# Patient Record
Sex: Female | Born: 1965 | Hispanic: No | Marital: Single | Smoking: Never smoker
Health system: Southern US, Community
[De-identification: ages and names within clinical notes are randomized; demographics above are authoritative.]

## PROBLEM LIST (undated history)

## (undated) DIAGNOSIS — I10 Essential (primary) hypertension: Secondary | ICD-10-CM

## (undated) HISTORY — PX: TONSILLECTOMY: SUR1361

## (undated) HISTORY — PX: TUBAL LIGATION: SHX77

---

## 2000-09-27 ENCOUNTER — Ambulatory Visit (HOSPITAL_COMMUNITY): Admission: RE | Admit: 2000-09-27 | Discharge: 2000-09-27 | Payer: Self-pay | Admitting: Nephrology

## 2000-09-29 ENCOUNTER — Ambulatory Visit (HOSPITAL_COMMUNITY): Admission: RE | Admit: 2000-09-29 | Discharge: 2000-09-30 | Payer: Self-pay | Admitting: Nephrology

## 2000-09-29 ENCOUNTER — Encounter: Payer: Self-pay | Admitting: Nephrology

## 2000-09-29 ENCOUNTER — Encounter (INDEPENDENT_AMBULATORY_CARE_PROVIDER_SITE_OTHER): Payer: Self-pay

## 2001-02-21 ENCOUNTER — Other Ambulatory Visit: Admission: RE | Admit: 2001-02-21 | Discharge: 2001-02-21 | Payer: Self-pay | Admitting: Gynecology

## 2001-03-05 ENCOUNTER — Encounter: Payer: Self-pay | Admitting: Gynecology

## 2001-03-09 ENCOUNTER — Encounter (INDEPENDENT_AMBULATORY_CARE_PROVIDER_SITE_OTHER): Payer: Self-pay | Admitting: Specialist

## 2001-03-09 ENCOUNTER — Ambulatory Visit (HOSPITAL_COMMUNITY): Admission: RE | Admit: 2001-03-09 | Discharge: 2001-03-09 | Payer: Self-pay | Admitting: Gynecology

## 2002-02-21 ENCOUNTER — Other Ambulatory Visit: Admission: RE | Admit: 2002-02-21 | Discharge: 2002-02-21 | Payer: Self-pay | Admitting: Gynecology

## 2002-02-26 ENCOUNTER — Encounter: Payer: Self-pay | Admitting: Gynecology

## 2002-02-26 ENCOUNTER — Encounter: Admission: RE | Admit: 2002-02-26 | Discharge: 2002-02-26 | Payer: Self-pay | Admitting: Gynecology

## 2003-03-26 ENCOUNTER — Other Ambulatory Visit: Admission: RE | Admit: 2003-03-26 | Discharge: 2003-03-26 | Payer: Self-pay | Admitting: Gynecology

## 2005-10-11 ENCOUNTER — Ambulatory Visit (HOSPITAL_COMMUNITY): Admission: RE | Admit: 2005-10-11 | Discharge: 2005-10-11 | Payer: Self-pay | Admitting: Obstetrics & Gynecology

## 2010-04-20 ENCOUNTER — Emergency Department (HOSPITAL_COMMUNITY)
Admission: EM | Admit: 2010-04-20 | Discharge: 2010-04-20 | Payer: Self-pay | Source: Home / Self Care | Admitting: Family Medicine

## 2010-10-03 ENCOUNTER — Inpatient Hospital Stay (HOSPITAL_COMMUNITY)
Admission: EM | Admit: 2010-10-03 | Discharge: 2010-10-07 | Payer: Self-pay | Source: Home / Self Care | Attending: Pulmonary Disease | Admitting: Pulmonary Disease

## 2010-11-02 ENCOUNTER — Ambulatory Visit: Admit: 2010-11-02 | Payer: Self-pay | Admitting: Internal Medicine

## 2010-11-03 ENCOUNTER — Telehealth: Payer: Self-pay | Admitting: Internal Medicine

## 2010-11-07 ENCOUNTER — Encounter: Payer: Self-pay | Admitting: Internal Medicine

## 2010-11-07 ENCOUNTER — Encounter: Payer: Self-pay | Admitting: Obstetrics & Gynecology

## 2010-11-18 NOTE — Progress Notes (Signed)
Summary: nos appt  Phone Note Call from Patient   Caller: [email or web address]  Call For: Bevelyn Arriola Summary of Call: [facility] x2 to rsc nos from 1/17. Initial call taken by: Darletta Moll,  November 03, 2010 3:03 PM

## 2010-12-27 LAB — POCT I-STAT 3, ART BLOOD GAS (G3+)
Acid-base deficit: 2 mmol/L (ref 0.0–2.0)
O2 Saturation: 96 %
O2 Saturation: 99 %
Patient temperature: 39.2
TCO2: 22 mmol/L (ref 0–100)
pCO2 arterial: 30.4 mmHg — ABNORMAL LOW (ref 35.0–45.0)
pCO2 arterial: 31.7 mmHg — ABNORMAL LOW (ref 35.0–45.0)
pH, Arterial: 7.449 — ABNORMAL HIGH (ref 7.350–7.400)
pO2, Arterial: 78 mmHg — ABNORMAL LOW (ref 80.0–100.0)

## 2010-12-27 LAB — POCT I-STAT, CHEM 8
Calcium, Ion: 0.96 mmol/L — ABNORMAL LOW (ref 1.12–1.32)
Glucose, Bld: 180 mg/dL — ABNORMAL HIGH (ref 70–99)
HCT: 36 % (ref 36.0–46.0)
Potassium: 3.2 mEq/L — ABNORMAL LOW (ref 3.5–5.1)
Sodium: 139 mEq/L (ref 135–145)

## 2010-12-27 LAB — LEGIONELLA ANTIGEN, URINE: Legionella Antigen, Urine: NEGATIVE

## 2010-12-27 LAB — BASIC METABOLIC PANEL
BUN: 4 mg/dL — ABNORMAL LOW (ref 6–23)
BUN: 5 mg/dL — ABNORMAL LOW (ref 6–23)
BUN: 8 mg/dL (ref 6–23)
CO2: 24 mEq/L (ref 19–32)
CO2: 25 mEq/L (ref 19–32)
Calcium: 7.2 mg/dL — ABNORMAL LOW (ref 8.4–10.5)
Calcium: 7.5 mg/dL — ABNORMAL LOW (ref 8.4–10.5)
Calcium: 8.1 mg/dL — ABNORMAL LOW (ref 8.4–10.5)
Chloride: 106 mEq/L (ref 96–112)
Creatinine, Ser: 0.7 mg/dL (ref 0.4–1.2)
Creatinine, Ser: 0.72 mg/dL (ref 0.4–1.2)
Creatinine, Ser: 0.77 mg/dL (ref 0.4–1.2)
Creatinine, Ser: 0.9 mg/dL (ref 0.4–1.2)
GFR calc Af Amer: >60 mL/min (ref >60)
GFR calc non Af Amer: 60 mL/min — ABNORMAL LOW (ref >60)
GFR calc non Af Amer: >60 mL/min (ref >60)
GFR calc non Af Amer: >60 mL/min (ref >60)
Glucose, Bld: 93 mg/dL (ref 70–99)
Glucose, Bld: 94 mg/dL (ref 70–99)
Potassium: 2.9 mEq/L — ABNORMAL LOW (ref 3.5–5.1)
Potassium: 3.3 mEq/L — ABNORMAL LOW (ref 3.5–5.1)
Sodium: 139 mEq/L (ref 135–145)

## 2010-12-27 LAB — URINE MICROSCOPIC-ADD ON

## 2010-12-27 LAB — CULTURE, BLOOD (ROUTINE X 2)
Culture  Setup Time: 201112181957
Culture: NO GROWTH

## 2010-12-27 LAB — URINE CULTURE: Culture: NO GROWTH

## 2010-12-27 LAB — MAGNESIUM
Magnesium: 1.4 mg/dL — ABNORMAL LOW (ref 1.5–2.5)
Magnesium: 2.1 mg/dL (ref 1.5–2.5)

## 2010-12-27 LAB — GLUCOSE, CAPILLARY
Glucose-Capillary: 114 mg/dL — ABNORMAL HIGH (ref 70–99)
Glucose-Capillary: 150 mg/dL — ABNORMAL HIGH (ref 70–99)
Glucose-Capillary: 83 mg/dL (ref 70–99)
Glucose-Capillary: 91 mg/dL (ref 70–99)
Glucose-Capillary: 93 mg/dL (ref 70–99)

## 2010-12-27 LAB — CULTURE, RESPIRATORY W GRAM STAIN: Culture: NORMAL

## 2010-12-27 LAB — URINALYSIS, ROUTINE W REFLEX MICROSCOPIC
Bilirubin Urine: NEGATIVE
Glucose, UA: NEGATIVE mg/dL
Ketones, ur: NEGATIVE mg/dL
Leukocytes, UA: NEGATIVE
Leukocytes, UA: NEGATIVE
Nitrite: NEGATIVE
Nitrite: NEGATIVE
Specific Gravity, Urine: 1.017 (ref 1.005–1.030)
Specific Gravity, Urine: 1.018 (ref 1.005–1.030)
Urobilinogen, UA: >8 mg/dL — ABNORMAL HIGH (ref 0.0–1.0)
pH: 6 (ref 5.0–8.0)

## 2010-12-27 LAB — PROTIME-INR: Prothrombin Time: 17 seconds — ABNORMAL HIGH (ref 11.6–15.2)

## 2010-12-27 LAB — CARBOXYHEMOGLOBIN
Carboxyhemoglobin: 1.1 % (ref 0.5–1.5)
Methemoglobin: 0.7 % (ref 0.0–1.5)
Methemoglobin: 0.8 % (ref 0.0–1.5)
Total hemoglobin: 10 g/dL — ABNORMAL LOW (ref 12.5–16.0)

## 2010-12-27 LAB — BLOOD GAS, ARTERIAL
Drawn by: 31843
O2 Content: 3 L/min
TCO2: 23 mmol/L (ref 0–100)
pCO2 arterial: 32.9 mmHg — ABNORMAL LOW (ref 35.0–45.0)
pH, Arterial: 7.441 — ABNORMAL HIGH (ref 7.350–7.400)
pO2, Arterial: 148 mmHg — ABNORMAL HIGH (ref 80.0–100.0)

## 2010-12-27 LAB — TROPONIN I: Troponin I: 0.15 ng/mL — ABNORMAL HIGH (ref 0.00–0.06)

## 2010-12-27 LAB — CBC
HCT: 27.6 % — ABNORMAL LOW (ref 36.0–46.0)
Hemoglobin: 9.2 g/dL — ABNORMAL LOW (ref 12.0–15.0)
MCH: 29.1 pg (ref 26.0–34.0)
MCHC: 33.3 g/dL (ref 30.0–36.0)
MCHC: 34.7 g/dL (ref 30.0–36.0)
MCHC: 34.7 g/dL (ref 30.0–36.0)
MCV: 85.2 fL (ref 78.0–100.0)
MCV: 85.4 fL (ref 78.0–100.0)
Platelets: 154 10*3/uL (ref 150–400)
Platelets: 173 10*3/uL (ref 150–400)
RBC: 4.13 MIL/uL (ref 3.87–5.11)
RDW: 13.8 % (ref 11.5–15.5)
RDW: 14.1 % (ref 11.5–15.5)

## 2010-12-27 LAB — PHOSPHORUS
Phosphorus: 1.6 mg/dL — ABNORMAL LOW (ref 2.3–4.6)
Phosphorus: 2.6 mg/dL (ref 2.3–4.6)

## 2010-12-27 LAB — DIFFERENTIAL
Basophils Absolute: 0 10*3/uL (ref 0.0–0.1)
Basophils Relative: 0 % (ref 0–1)
Eosinophils Relative: 0 % (ref 0–5)
Lymphs Abs: 0.7 10*3/uL (ref 0.7–4.0)
Neutrophils Relative %: 93 % — ABNORMAL HIGH (ref 43–77)

## 2010-12-27 LAB — POCT PREGNANCY, URINE: Preg Test, Ur: NEGATIVE

## 2010-12-27 LAB — STREP PNEUMONIAE URINARY ANTIGEN: Strep Pneumo Urinary Antigen: NEGATIVE

## 2010-12-27 LAB — EXPECTORATED SPUTUM ASSESSMENT W GRAM STAIN, RFLX TO RESP C

## 2010-12-27 LAB — CARDIAC PANEL(CRET KIN+CKTOT+MB+TROPI)
Relative Index: 0.4 (ref 0.0–2.5)
Troponin I: 0.04 ng/mL (ref 0.00–0.06)

## 2010-12-27 LAB — PROCALCITONIN: Procalcitonin: 11.3 ng/mL

## 2010-12-27 LAB — POCT CARDIAC MARKERS: Troponin i, poc: <0.05 ng/mL (ref 0.00–0.09)

## 2010-12-27 LAB — PREALBUMIN: Prealbumin: 6.7 mg/dL — ABNORMAL LOW (ref 18.0–45.0)

## 2011-01-02 LAB — URINE CULTURE

## 2011-01-02 LAB — DIFFERENTIAL
Basophils Absolute: 0 10*3/uL (ref 0.0–0.1)
Basophils Relative: 1 % (ref 0–1)
Eosinophils Absolute: 0.1 10*3/uL (ref 0.0–0.7)
Lymphs Abs: 1.4 10*3/uL (ref 0.7–4.0)
Neutrophils Relative %: 72 % (ref 43–77)

## 2011-01-02 LAB — POCT URINALYSIS DIP (DEVICE)
Glucose, UA: NEGATIVE mg/dL
Nitrite: NEGATIVE
Urobilinogen, UA: 0.2 mg/dL (ref 0.0–1.0)

## 2011-01-02 LAB — CBC
MCH: 29.9 pg (ref 26.0–34.0)
MCHC: 33.5 g/dL (ref 30.0–36.0)
Platelets: 186 10*3/uL (ref 150–400)
RBC: 4.06 MIL/uL (ref 3.87–5.11)

## 2011-03-04 NOTE — Op Note (Signed)
[facility]  Patient:    Brooke Hanna, Brooke Hanna                    MRN: [id number] Proc. Date: 03/09/01 Adm. Date:  [id number] Attending:  Katrina Stack                           Operative Report  PREOPERATIVE DIAGNOSES: 1. Desires sterilization. 2. Clitoral lesion compatible with human papillomavirus.  POSTOPERATIVE DIAGNOSES: 1. Desires sterilization. 2. Clitoral lesion compatible with human papillomavirus.  PROCEDURES:  Filshie clip sterilization and excision of clitoral human papillomavirus, rule out dysplasia.  ANESTHESIA:  IV sedation plus paracervical block.  SURGEON:  Gretta Cool, M.D.  DESCRIPTION OF PROCEDURE:  Under excellent IV sedation with local anesthesia to the umbilicus, a Veress cannula was introduced after adequate pneumoperitoneum.  The laparoscope trocar was introduced and the pelvic organ was visualized.  Nitrous oxide was used for the pneumoperitoneum.  The fallopian tubes were then anesthetized with 0.5% Marcaine with topical application.  Filshie clips were then applied approximately a centimeter lateral to the uterine insertion on each side.  The clip was observe to extend completely across the lumen of the tube.  The clip was applied at 90 degree angles to the tube.  There was no other evidence of pelvic pathology.  At the end of the procedure sponge and lap counts were correct.  There were no complications and that portion of the procedure was terminated.  Attention was then turned to the clitoral lesion.  The lesion was infiltrated with 0.5% Marcaine.  The lesion was then shaved so as to remove the entire lesion only.  At this point, once the lesion was shaved the base was treated with Monsels solution to control bleeding so that suture would not be required.  The lesion was at 12 oclock on the clitoral hood.  At this point the procedure was terminated without complication and the patient returned to the  recovery room in excellent condition.  Clitoral lesion to pathology. DD:  03/09/01 TD:  03/09/01 Job: [id number]

## 2011-10-26 ENCOUNTER — Encounter (HOSPITAL_COMMUNITY): Payer: Self-pay | Admitting: Emergency Medicine

## 2011-10-26 ENCOUNTER — Emergency Department (HOSPITAL_COMMUNITY): Payer: Self-pay

## 2011-10-26 ENCOUNTER — Emergency Department (HOSPITAL_COMMUNITY)
Admission: EM | Admit: 2011-10-26 | Discharge: 2011-10-26 | Disposition: A | Discharge status: Home / Self Care | Payer: Self-pay | Attending: Emergency Medicine | Admitting: Emergency Medicine

## 2011-10-26 DIAGNOSIS — R079 Chest pain, unspecified: Secondary | ICD-10-CM | POA: Insufficient documentation

## 2011-10-26 DIAGNOSIS — J3489 Other specified disorders of nose and nasal sinuses: Secondary | ICD-10-CM | POA: Insufficient documentation

## 2011-10-26 DIAGNOSIS — R05 Cough: Secondary | ICD-10-CM | POA: Insufficient documentation

## 2011-10-26 DIAGNOSIS — J40 Bronchitis, not specified as acute or chronic: Secondary | ICD-10-CM | POA: Insufficient documentation

## 2011-10-26 DIAGNOSIS — Z79899 Other long term (current) drug therapy: Secondary | ICD-10-CM | POA: Insufficient documentation

## 2011-10-26 DIAGNOSIS — I10 Essential (primary) hypertension: Secondary | ICD-10-CM | POA: Insufficient documentation

## 2011-10-26 DIAGNOSIS — R0602 Shortness of breath: Secondary | ICD-10-CM | POA: Insufficient documentation

## 2011-10-26 DIAGNOSIS — R059 Cough, unspecified: Secondary | ICD-10-CM | POA: Insufficient documentation

## 2011-10-26 HISTORY — DX: Essential (primary) hypertension: I10

## 2011-10-26 MED ORDER — BENZONATATE 100 MG PO CAPS: 100.0000 mg | ORAL_CAPSULE | Freq: Three times a day (TID) | ORAL | Status: AC

## 2011-10-26 MED ORDER — ALBUTEROL SULFATE HFA 108 (90 BASE) MCG/ACT IN AERS: 1.0000 | INHALATION_SPRAY | Freq: Four times a day (QID) | RESPIRATORY_TRACT | Status: DC | PRN

## 2011-10-26 NOTE — ED Provider Notes (Signed)
History     CSN: [id number]  Arrival date & time 10/26/11  2130   First MD Initiated Contact with Patient 10/26/11 1101      Chief Complaint  Patient presents with  . Cough    Patient is a 46 y.o. female presenting with cough. The history is provided by the patient.  Cough This is a new problem. The current episode started more than 1 week ago. The problem occurs constantly. The problem has not changed since onset.The cough is productive of sputum. There has been no fever. Associated symptoms include chest pain and rhinorrhea. Pertinent negatives include no shortness of breath and no wheezing. She has tried decongestants for the symptoms. She is not a smoker. Her past medical history is significant for pneumonia.    Past Medical History  Diagnosis Date  . Hypertension     History reviewed. No pertinent past surgical history.  No family history on file.  History  Substance Use Topics  . Smoking status: Never Smoker   . Smokeless tobacco: Not on file  . Alcohol Use: No    OB History    Grav Para Term Preterm Abortions TAB SAB Ect Mult Living                  Review of Systems  HENT: Positive for rhinorrhea.   Respiratory: Positive for cough. Negative for shortness of breath and wheezing.   Cardiovascular: Positive for chest pain.  All other systems reviewed and are negative.    Allergies  Sulfa antibiotics  Home Medications   Current Outpatient Rx  Name Route Sig Dispense Refill  . GUAIFENESIN ER 600 MG PO TB12 Oral Take 1,200 mg by mouth 2 (two) times daily.    Marland Kitchen SALINE 0.65 % NA SOLN Nasal Place 1 spray into the nose as needed. For nasal irritation/congestion    . VALSARTAN-HYDROCHLOROTHIAZIDE 160-25 MG PO TABS Oral Take 1 tablet by mouth daily.      There were no vitals taken for this visit.  Physical Exam  Nursing note and vitals reviewed. Constitutional: She appears well-developed and well-nourished. No distress.  HENT:  Head: Normocephalic and  atraumatic.  Right Ear: External ear normal.  Left Ear: External ear normal.  Eyes: Conjunctivae are normal. Right eye exhibits no discharge. Left eye exhibits no discharge. No scleral icterus.  Neck: Neck supple. No tracheal deviation present.  Cardiovascular: Normal rate, regular rhythm and intact distal pulses.   Pulmonary/Chest: Effort normal and breath sounds normal. No stridor. No respiratory distress. She has no wheezes. She has no rales.  Abdominal: Soft. Bowel sounds are normal. She exhibits no distension. There is no tenderness. There is no rebound and no guarding.  Musculoskeletal: She exhibits no edema and no tenderness.  Neurological: She is alert. She has normal strength. No sensory deficit. Cranial nerve deficit:  no gross defecits noted. She exhibits normal muscle tone. She displays no seizure activity. Coordination normal.  Skin: Skin is warm and dry. No rash noted.  Psychiatric: She has a normal mood and affect.    ED Course  Procedures (including critical care time)  Labs Reviewed - No data to display Dg Chest 2 View  10/26/2011  *RADIOLOGY REPORT*  Clinical Data: Cough for 3 weeks, shortness of breath  CHEST - 2 VIEW  Comparison: 10/05/2010  Findings: Upper-normal size of cardiac silhouette. Mediastinal contours and pulmonary vascularity normal. Bronchitic changes without infiltrate or effusion. No pneumothorax. Bones unremarkable.  IMPRESSION: Mild bronchitic changes.  Original Report  Authenticated By: Lollie Marrow, M.D.     1. Bronchitis       MDM  Patient's symptoms suggestive of bronchitis. There is no evidence of pneumonia on x-ray. I will try a course of inhalers and cough suppressants for symptomatic relief. Patient encouraged to followup with a primary care Dr. if not improving.        Celene Kras, MD 10/26/11 [phone]

## 2011-10-26 NOTE — ED Notes (Signed)
Cough x 3 weeks states has been taking otc meds not helping

## 2011-12-26 ENCOUNTER — Encounter (INDEPENDENT_AMBULATORY_CARE_PROVIDER_SITE_OTHER): Payer: PRIVATE HEALTH INSURANCE | Admitting: Obstetrics and Gynecology

## 2011-12-26 DIAGNOSIS — R87619 Unspecified abnormal cytological findings in specimens from cervix uteri: Secondary | ICD-10-CM

## 2014-05-17 ENCOUNTER — Emergency Department (HOSPITAL_COMMUNITY): Payer: 59

## 2014-05-17 ENCOUNTER — Encounter (HOSPITAL_COMMUNITY): Payer: Self-pay | Admitting: Emergency Medicine

## 2014-05-17 ENCOUNTER — Emergency Department (HOSPITAL_COMMUNITY)
Admission: EM | Admit: 2014-05-17 | Discharge: 2014-05-17 | Disposition: A | Discharge status: Home / Self Care | Payer: 59 | Attending: Emergency Medicine | Admitting: Emergency Medicine

## 2014-05-17 DIAGNOSIS — J029 Acute pharyngitis, unspecified: Secondary | ICD-10-CM | POA: Insufficient documentation

## 2014-05-17 DIAGNOSIS — R51 Headache: Secondary | ICD-10-CM | POA: Diagnosis not present

## 2014-05-17 DIAGNOSIS — J208 Acute bronchitis due to other specified organisms: Secondary | ICD-10-CM

## 2014-05-17 DIAGNOSIS — Z21 Asymptomatic human immunodeficiency virus [HIV] infection status: Secondary | ICD-10-CM | POA: Insufficient documentation

## 2014-05-17 DIAGNOSIS — R05 Cough: Secondary | ICD-10-CM | POA: Insufficient documentation

## 2014-05-17 DIAGNOSIS — J069 Acute upper respiratory infection, unspecified: Secondary | ICD-10-CM | POA: Diagnosis not present

## 2014-05-17 DIAGNOSIS — R059 Cough, unspecified: Secondary | ICD-10-CM | POA: Insufficient documentation

## 2014-05-17 DIAGNOSIS — E119 Type 2 diabetes mellitus without complications: Secondary | ICD-10-CM | POA: Insufficient documentation

## 2014-05-17 DIAGNOSIS — J209 Acute bronchitis, unspecified: Secondary | ICD-10-CM | POA: Diagnosis not present

## 2014-05-17 DIAGNOSIS — Z79899 Other long term (current) drug therapy: Secondary | ICD-10-CM | POA: Insufficient documentation

## 2014-05-17 DIAGNOSIS — R000 Tachycardia, unspecified: Secondary | ICD-10-CM | POA: Diagnosis not present

## 2014-05-17 DIAGNOSIS — I10 Essential (primary) hypertension: Secondary | ICD-10-CM | POA: Diagnosis not present

## 2014-05-17 DIAGNOSIS — B9789 Other viral agents as the cause of diseases classified elsewhere: Secondary | ICD-10-CM

## 2014-05-17 LAB — RAPID STREP SCREEN (MED CTR MEBANE ONLY): STREPTOCOCCUS, GROUP A SCREEN (DIRECT): NEGATIVE

## 2014-05-17 MED ORDER — IBUPROFEN 800 MG PO TABS: 800.0000 mg | ORAL_TABLET | Freq: Three times a day (TID) | ORAL | Status: DC

## 2014-05-17 MED ORDER — HYDROCODONE-HOMATROPINE 5-1.5 MG/5ML PO SYRP: 5.0000 mL | ORAL_SOLUTION | Freq: Four times a day (QID) | ORAL | Status: DC | PRN

## 2014-05-17 MED ORDER — HYDROCODONE-ACETAMINOPHEN 5-325 MG PO TABS
2.0000 | ORAL_TABLET | Freq: Once | ORAL | Status: AC
  Administered 2014-05-17: 2 via ORAL
  Filled 2014-05-17: qty 2

## 2014-05-17 MED ORDER — GUAIFENESIN ER 600 MG PO TB12: 1200.0000 mg | ORAL_TABLET | Freq: Two times a day (BID) | ORAL | Status: DC

## 2014-05-17 MED ORDER — BENZONATATE 100 MG PO CAPS
200.0000 mg | ORAL_CAPSULE | Freq: Once | ORAL | Status: AC
  Administered 2014-05-17: 200 mg via ORAL
  Filled 2014-05-17: qty 2

## 2014-05-17 MED ORDER — MOMETASONE FUROATE 50 MCG/ACT NA SUSP: 2.0000 | Freq: Every day | NASAL | Status: DC

## 2014-05-17 MED ORDER — SODIUM CHLORIDE 0.9 % IV BOLUS (SEPSIS)
2000.0000 mL | Freq: Once | INTRAVENOUS | Status: AC
  Administered 2014-05-17: 2000 mL via INTRAVENOUS

## 2014-05-17 MED ORDER — ALBUTEROL SULFATE HFA 108 (90 BASE) MCG/ACT IN AERS
2.0000 | INHALATION_SPRAY | Freq: Once | RESPIRATORY_TRACT | Status: AC
  Administered 2014-05-17: 2 via RESPIRATORY_TRACT
  Filled 2014-05-17: qty 6.7

## 2014-05-17 NOTE — ED Provider Notes (Signed)
Medical screening examination/treatment/procedure(s) were performed by non-physician practitioner and as supervising physician I was immediately available for consultation/collaboration.   EKG Interpretation None        Ezequiel Essex, MD 05/17/14 2351

## 2014-05-17 NOTE — Discharge Instructions (Signed)
1. Medications: nasonex, mucinex, ibuprofen, hycidan, usual home medications 2. Treatment: rest, drink plenty of fluids, take tylenol or ibuprofen for fever control 3. Follow Up: Please followup with your primary doctor for discussion of your diagnoses and further evaluation after today's visit; if you do not have a primary care doctor use the resource guide provided to find one;      Acute Bronchitis Bronchitis is inflammation of the airways that extend from the windpipe into the lungs (bronchi). The inflammation often causes mucus to develop. This leads to a cough, which is the most common symptom of bronchitis.  In acute bronchitis, the condition usually develops suddenly and goes away over time, usually in a couple weeks. Smoking, allergies, and asthma can make bronchitis worse. Repeated episodes of bronchitis may cause further lung problems.  CAUSES Acute bronchitis is most often caused by the same virus that causes a cold. The virus can spread from person to person (contagious) through coughing, sneezing, and touching contaminated objects. SIGNS AND SYMPTOMS   Cough.   Fever.   Coughing up mucus.   Body aches.   Chest congestion.   Chills.   Shortness of breath.   Sore throat.  DIAGNOSIS  Acute bronchitis is usually diagnosed through a physical exam. Your health care provider will also ask you questions about your medical history. Tests, such as chest X-rays, are sometimes done to rule out other conditions.  TREATMENT  Acute bronchitis usually goes away in a couple weeks. Oftentimes, no medical treatment is necessary. Medicines are sometimes given for relief of fever or cough. Antibiotic medicines are usually not needed but may be prescribed in certain situations. In some cases, an inhaler may be recommended to help reduce shortness of breath and control the cough. A cool mist vaporizer may also be used to help thin bronchial secretions and make it easier to clear the  chest.  HOME CARE INSTRUCTIONS  Get plenty of rest.   Drink enough fluids to keep your urine clear or pale yellow (unless you have a medical condition that requires fluid restriction). Increasing fluids may help thin your respiratory secretions (sputum) and reduce chest congestion, and it will prevent dehydration.   Take medicines only as directed by your health care provider.  If you were prescribed an antibiotic medicine, finish it all even if you start to feel better.  Avoid smoking and secondhand smoke. Exposure to cigarette smoke or irritating chemicals will make bronchitis worse. If you are a smoker, consider using nicotine gum or skin patches to help control withdrawal symptoms. Quitting smoking will help your lungs heal faster.   Reduce the chances of another bout of acute bronchitis by washing your hands frequently, avoiding people with cold symptoms, and trying not to touch your hands to your mouth, nose, or eyes.   Keep all follow-up visits as directed by your health care provider.  SEEK MEDICAL CARE IF: Your symptoms do not improve after 1 week of treatment.  SEEK IMMEDIATE MEDICAL CARE IF:  You develop an increased fever or chills.   You have chest pain.   You have severe shortness of breath.  You have bloody sputum.   You develop dehydration.  You faint or repeatedly feel like you are going to pass out.  You develop repeated vomiting.  You develop a severe headache. MAKE SURE YOU:   Understand these instructions.  Will watch your condition.  Will get help right away if you are not doing well or get worse. Document Released: 11/10/2004  Document Revised: 02/17/2014 Document Reviewed: 03/26/2013 [facility] Patient Information 2015 [place], [place]. This information is not intended to replace advice given to you by your health care provider. Make sure you discuss any questions you have with your health care provider.    Cool Mist Vaporizers Vaporizers  may help relieve the symptoms of a cough and cold. They add moisture to the air, which helps mucus to become thinner and less sticky. This makes it easier to breathe and cough up secretions. Cool mist vaporizers do not cause serious burns like hot mist vaporizers, which may also be called steamers or humidifiers. Vaporizers have not been proven to help with colds. You should not use a vaporizer if you are allergic to mold. HOME CARE INSTRUCTIONS  Follow the package instructions for the vaporizer.  Do not use anything other than distilled water in the vaporizer.  Do not run the vaporizer all of the time. This can cause mold or bacteria to grow in the vaporizer.  Clean the vaporizer after each time it is used.  Clean and dry the vaporizer well before storing it.  Stop using the vaporizer if worsening respiratory symptoms develop. Document Released: 06/30/2004 Document Revised: 10/08/2013 Document Reviewed: 02/20/2013 [facility] Patient Information 2015 [place], [place]. This information is not intended to replace advice given to you by your health care provider. Make sure you discuss any questions you have with your health care provider.    Cough, Adult  A cough is a reflex that helps clear your throat and airways. It can help heal the body or may be a reaction to an irritated airway. A cough may only last 2 or 3 weeks (acute) or may last more than 8 weeks (chronic).  CAUSES Acute cough:  Viral or bacterial infections. Chronic cough:  Infections.  Allergies.  Asthma.  Post-nasal drip.  Smoking.  Heartburn or acid reflux.  Some medicines.  Chronic lung problems (COPD).  Cancer. SYMPTOMS   Cough.  Fever.  Chest pain.  Increased breathing rate.  High-pitched whistling sound when breathing (wheezing).  Colored mucus that you cough up (sputum). TREATMENT   A bacterial cough may be treated with antibiotic medicine.  A viral cough must run its course and will not  respond to antibiotics.  Your caregiver may recommend other treatments if you have a chronic cough. HOME CARE INSTRUCTIONS   Only take over-the-counter or prescription medicines for pain, discomfort, or fever as directed by your caregiver. Use cough suppressants only as directed by your caregiver.  Use a cold steam vaporizer or humidifier in your bedroom or home to help loosen secretions.  Sleep in a semi-upright position if your cough is worse at night.  Rest as needed.  Stop smoking if you smoke. SEEK IMMEDIATE MEDICAL CARE IF:   You have pus in your sputum.  Your cough starts to worsen.  You cannot control your cough with suppressants and are losing sleep.  You begin coughing up blood.  You have difficulty breathing.  You develop pain which is getting worse or is uncontrolled with medicine.  You have a fever. MAKE SURE YOU:   Understand these instructions.  Will watch your condition.  Will get help right away if you are not doing well or get worse. Document Released: 04/01/2011 Document Revised: 12/26/2011 Document Reviewed: 04/01/2011 [facility] Patient Information 2015 [place], [place]. This information is not intended to replace advice given to you by your health care provider. Make sure you discuss any questions you have with your health care  provider.    Emergency Department Resource Guide 1) Find a Doctor and Pay Out of Pocket Although you won't have to find out who is covered by your insurance plan, it is a good idea to ask around and get recommendations. You will then need to call the office and see if the doctor you have chosen will accept you as a new patient and what types of options they offer for patients who are self-pay. Some doctors offer discounts or will set up payment plans for their patients who do not have insurance, but you will need to ask so you aren't surprised when you get to your appointment.  2) Contact Your Local Health Department Not all  health departments have doctors that can see patients for sick visits, but many do, so it is worth a call to see if yours does. If you don't know where your local health department is, you can check in your phone book. The CDC also has a tool to help you locate your state's health department, and many state websites also have listings of all of their local health departments.  3) Find a [facility] If your illness is not likely to be very severe or complicated, you may want to try a walk in clinic. These are popping up all over the country in pharmacies, drugstores, and shopping centers. They're usually staffed by nurse practitioners or physician assistants that have been trained to treat common illnesses and complaints. They're usually fairly quick and inexpensive. However, if you have serious medical issues or chronic medical problems, these are probably not your best option.  No Primary Care Doctor: - Call Health Connect at  [phone] - they can help you locate a primary care doctor that  accepts your insurance, provides certain services, etc. - Physician Referral Service- [phone]  Chronic Pain Problems: Organization         Address  Phone   Notes  [facility]  [phone] Patients need to be referred by their primary care doctor.   Medication Assistance: Organization         Address  Phone   Notes  [facility] Medication [facility] [address] [place], [place] [address] [phone] --Must be a resident of [facility] -- Must have NO insurance coverage whatsoever (no Medicaid/ Medicare, etc.) -- The pt. MUST have a primary care doctor that directs their care regularly and follows them in the community   [facility]  [phone]   [employer]  [phone]    Agencies that provide inexpensive medical care: Organization         Address  Phone   Notes  [place]  [phone]   [facility]     [phone]   [facility] [place], [place] [address] [phone]   [place] [address], [place] [phone]   Planned Parenthood    [phone]   [facility]    [phone]   [place] and [place] [address], [place] Phone:  [phone], Fax:  [phone] Hours of Operation:  9 am - 6 pm, M-F.  Also accepts Medicaid/Medicare and self-pay.  [facility] for [place], [address], [place] Phone: [phone], Fax: [phone]. Hours of Operation:  8:30 am - 5:30 pm, M-F.  Also accepts Medicaid and self-pay.  [facility] [place] [address] [place] Phone: [phone]   [place], [place], [place] [phone], Ext. [phone] Mondays & Thursdays: 7-9 AM.  First 15 patients are seen on a first come, first serve basis.    [place] Providers:  Organization         Address  Phone   Notes  [facility] [address], [place] [phone] Also accepts self-pay patients.  [facility] [address], [place]  [phone]   [place], [address], [place] [phone]   [facility] [address] [place] [phone]   Lucianne Lei [address], [place]   [phone] Only accepts [place] Access [place] patients after they have their name applied to their card.   Self-Pay (no insurance) in [facility]:  Organization         Address  Phone   Notes  Sickle Cell Patients, [facility] [place] [phone]   [facility] Care [place] [phone]   [facility] [place]  [place], [place], [place] [phone]     [facility]/Dr. Osei-Bonsu  [address], [place] or [address], [place] [phone] Phone number for both [place] and [place] locations is the same.  [facility] [address] [place] [phone]   [facility] [address] [place] or [address] [phone]   [facility] [address], [place] [phone], phone; [phone], fax Sees patients 1st and 3rd Saturday of every month.  Must not qualify for public or private insurance (i.e. Medicaid, Medicare, [place] Health Choice, Veterans' Benefits)  Household income should be no more than 200% of the poverty level The clinic cannot treat you if you are pregnant or think you are pregnant  Sexually transmitted diseases are not treated at the clinic.    Dental Care: Organization         Address  Phone  Notes  [facility] Department of [facility] [phone] Accepts children up to age 56 who are enrolled in [place] or [place]; pregnant women with a Medicaid card; and children who have applied for Medicaid or [place] Health Choice, but were declined, whose parents can pay a reduced fee at time of service.  [facility] Department of [facility]  [address], [place] [phone] Accepts children up to age 15 who are enrolled in [place] or [place]; pregnant women with a Medicaid card; and children who have applied for Medicaid or [place] Health Choice, but were declined, whose parents can pay a reduced fee at time of service.  [place] Adult Dental Access PROGRAM  [place] [phone] Patients are seen by appointment only. Walk-ins are not accepted. [place] will see patients 66 years of age and older. Monday - Tuesday (8am-5pm) Most Wednesdays (8:30-5pm) $30 per visit,  cash only  [facility] Adult Dental Access PROGRAM  [address], [facility] [phone] Patients are seen by appointment only. Walk-ins are not accepted. [place] will see patients 18 years  of age and older. One Wednesday Evening (Monthly: Volunteer Based).  $30 per visit, cash only  [place]  [phone] for adults; Children under age 28, call Graduate Pediatric Dentistry at [phone]. Children aged 68-14, please call [phone] to request a pediatric application.  Dental services are provided in all areas of dental care including fillings, crowns and bridges, complete and partial dentures, implants, gum treatment, root canals, and extractions. Preventive care is also provided. Treatment is provided to both adults and children. Patients are selected via a lottery and there is often a waiting list.   [facility] [address] [place]  [phone] [email or web address]   [facility] [address] [place], [place] [phone], Ext. [phone] Second and Fourth Thursday of each month, opens at 6:30 AM; Clinic ends at 9 AM.  Patients are seen on a first-come first-served basis, and a limited number are seen during each clinic.   [facility]  [address] Hillard Danker [place], [place] [phone]   Eligibility Requirements You must have lived in [place], [place], or [place] counties for at least the last three months.   You cannot be eligible for state or federal sponsored [employer], including [employer], [place], or [employer].   You generally cannot be eligible for healthcare insurance through your employer.    How to apply: Eligibility screenings are held every Tuesday and Wednesday afternoon from 1:00 pm until 4:00 pm. You do not need an appointment for the interview!  [facility] [address], [place], [place]   [place]   [place] Department  [place]  [phone]    Behavioral Health Resources in the Community: Intensive Outpatient Programs Organization         Address  Phone  Notes  [place] [place], [place] [phone]   [facility] Outpatient [address] [place], [place]   ADS: Alcohol & Drug Svcs [address], [place], [place]   Rafael Capo [address]  [place], [place] or [phone]   Substance Abuse Resources Organization         Address  Phone  Notes  Alcohol and Drug Services  [phone]   [place]  [phone]   [facility] [place]   Chinita Pester  [phone]   Residential & Outpatient Substance Abuse Program  [phone]   Psychological Services Organization         Address  Phone  Notes  [facility] [place]  [place]  [phone]   [place] [address], [place] or [phone]    Mobile Crisis Teams Organization         Address  Phone  Notes  [facility], Mobile Crisis Care Unit  [phone]   [facility]ive Psychotherapeutic Services  [address] [place], [place]   [place] Levels [address], [place] [phone]    Self-Help/Support Groups Organization         Address  Phone             Notes  [place]. of [place] - variety of support groups  [place] Call for more information  Narcotics Anonymous (NA), [facility] [address], [place]  2  meetings at this location   Residential Treatment Programs Organization         Address  Phone  Notes  [facility] [address],    [place]  [phone]   [facility]  [address] [place] [address], [place], [place]    [place], [place] [phone] Admissions: 8am-3pm M-F  Incentives Substance [place] [address]    [place], [place] [phone]   [facility] [address] [place], [place], [place]   [facility] [address],  [place], [place]   [facility] - Intensive Outpatient [address] Kristeen [address], [place], [place]   [facility] [address]  [place], [place] [phone] or [phone]   [facility] Services (RTS) [address], [place], [place] Accepts Medicaid  [facility] [place] [address]  [place] [phone] Substance Abuse/Addiction Treatment   [facility] Organization         Address  Phone  Notes  [facility]  [phone]   Domenic Schwab, PhD [address] Arlis [place], [place]   [phone] or [phone]   [place], [place] [phone]   [facility] [phone] [place], [place] [phone] Insurance/Medicaid/sponsorship through [facility] and Families [address], [address]                                    [place], [place] [phone] [place] [address], [place] [phone]    Dr. Adele Schilder  [phone]   [facility] [place] Dept. 1) [address], [place] 2) [place] 3)  [place] [address], [place] [phone]  [phone]   [place] [phone] or [phone] (After Hours)

## 2014-05-17 NOTE — ED Notes (Signed)
Patient transported to X-ray 

## 2014-05-17 NOTE — ED Notes (Signed)
Pt states she began running a fever, congested cough, and runny nose since Tuesday.  Pt states last time she felt this way she had pneumonia.

## 2014-05-17 NOTE — ED Provider Notes (Signed)
CSN: [id number]     Arrival date & time 05/17/14  1945 History   First MD Initiated Contact with Patient 05/17/14 2017     Chief Complaint  Patient presents with  . Cough  . Fever     (Consider location/radiation/quality/duration/timing/severity/associated sxs/prior Treatment) The history is provided by the patient and medical records. No language interpreter was used.    Brooke Hanna is a 48 y.o. female  with a hx of HTN presents to the Emergency Department complaining of gradual, persistent, progressively worsening cough with associated nasal congestion, sore throat, subjective fever onset 4 days ago.  Pt reports she is taking Alkaseltzer cold which improves the symptoms, but they quickly return and she continues to feel ill.  Associated symptoms include body aches, chills, chest wall pain with coughing.  Nothing makes the symptoms worse.  Pt denies neck stiffness, shortest breath, abdominal pain, nausea, vomiting, diarrhea, dizziness, syncope, dysuria.     Past Medical History  Diagnosis Date  . Hypertension    History reviewed. No pertinent past surgical history. No family history on file. History  Substance Use Topics  . Smoking status: Never Smoker   . Smokeless tobacco: Not on file  . Alcohol Use: No   OB History   Grav Para Term Preterm Abortions TAB SAB Ect Mult Living                 Review of Systems  Constitutional: Positive for fatigue. Negative for fever, chills, diaphoresis, appetite change and unexpected weight change.  HENT: Positive for congestion, postnasal drip, rhinorrhea, sinus pressure and sore throat. Negative for ear discharge, ear pain and mouth sores.   Eyes: Negative for visual disturbance.  Respiratory: Positive for cough, chest tightness, shortness of breath and wheezing. Negative for stridor.   Cardiovascular: Positive for chest pain (with cough only). Negative for palpitations and leg swelling.  Gastrointestinal: Negative for nausea,  vomiting, abdominal pain, diarrhea and constipation.  Endocrine: Negative for polydipsia, polyphagia and polyuria.  Genitourinary: Negative for dysuria, urgency, frequency and hematuria.  Musculoskeletal: Negative for arthralgias, back pain, myalgias and neck stiffness.  Skin: Negative for rash.  Allergic/Immunologic: Negative for immunocompromised state.  Neurological: Positive for headaches ( generalized, throbbing - worse frontally over the sinuses). Negative for syncope, light-headedness and numbness.  Hematological: Negative for adenopathy. Does not bruise/bleed easily.  Psychiatric/Behavioral: Negative for sleep disturbance. The patient is not nervous/anxious.   All other systems reviewed and are negative.     Allergies  Sulfa antibiotics  Home Medications   Prior to Admission medications   Medication Sig Start Date End Date Taking? Authorizing Provider  albuterol (PROVENTIL HFA;VENTOLIN HFA) 108 (90 BASE) MCG/ACT inhaler Inhale 1-2 puffs into the lungs every 6 (six) hours as needed for wheezing. 10/26/11 10/25/12  Dorie Rank, MD  guaiFENesin (MUCINEX) 600 MG 12 hr tablet Take 1,200 mg by mouth 2 (two) times daily.    Historical Provider, MD  guaiFENesin (MUCINEX) 600 MG 12 hr tablet Take 2 tablets (1,200 mg total) by mouth 2 (two) times daily. 05/17/14   Wilmetta Speiser, PA-C  HYDROcodone-homatropine (HYCODAN) 5-1.5 MG/5ML syrup Take 5 mLs by mouth every 6 (six) hours as needed for cough. 05/17/14   Jaida Basurto, PA-C  ibuprofen (ADVIL,MOTRIN) 800 MG tablet Take 1 tablet (800 mg total) by mouth 3 (three) times daily. 05/17/14   Vergene Marland, PA-C  mometasone (NASONEX) 50 MCG/ACT nasal spray Place 2 sprays into the nose daily. 05/17/14   Hillarie Harrigan, PA-C  sodium chloride (  OCEAN) 0.65 % SOLN nasal spray Place 1 spray into the nose as needed. For nasal irritation/congestion    Historical Provider, MD  valsartan-hydrochlorothiazide (DIOVAN-HCT) 160-25 MG per tablet Take 1  tablet by mouth daily.    Historical Provider, MD   BP 144/95  Pulse 95  Temp(Src) 100 F (37.8 C) (Oral)  Resp 20  Ht 5\' 3"  (1.6 m)  Wt 216 lb (97.977 kg)  BMI 38.27 kg/m2  SpO2 100% Physical Exam  Nursing note and vitals reviewed. Constitutional: She is oriented to person, place, and time. She appears well-developed and well-nourished. No distress.  Awake, alert, nontoxic appearance  HENT:  Head: Normocephalic and atraumatic.  Right Ear: Tympanic membrane, external ear and ear canal normal.  Left Ear: Tympanic membrane, external ear and ear canal normal.  Nose: Mucosal edema and rhinorrhea present. No epistaxis. Right sinus exhibits no maxillary sinus tenderness and no frontal sinus tenderness. Left sinus exhibits no maxillary sinus tenderness and no frontal sinus tenderness.  Mouth/Throat: Uvula is midline and mucous membranes are normal. Mucous membranes are not pale and not cyanotic. Posterior oropharyngeal erythema present. No oropharyngeal exudate, posterior oropharyngeal edema or tonsillar abscesses.  Posterior oropharyngeal erythema without exudate, edema Tonsils surgically absent  Eyes: Conjunctivae and EOM are normal. Pupils are equal, round, and reactive to light. No scleral icterus.  Neck: Normal range of motion and full passive range of motion without pain. Neck supple.  Cardiovascular: Regular rhythm, normal heart sounds and intact distal pulses.   Mild tachycardia  Pulmonary/Chest: Effort normal and breath sounds normal. No stridor. No respiratory distress. She has no wheezes. She exhibits tenderness (anterior).  Course breath sounds throughout, equal without focal wheezes, rhonchi, rales  Abdominal: Soft. Bowel sounds are normal. She exhibits no mass. There is no tenderness. There is no rebound and no guarding.  Musculoskeletal: Normal range of motion. She exhibits no edema.  Lymphadenopathy:    She has no cervical adenopathy.  Neurological: She is alert and oriented  to person, place, and time.  Speech is clear and goal oriented Moves extremities without ataxia  Skin: Skin is warm and dry. No rash noted. She is not diaphoretic.  Psychiatric: She has a normal mood and affect.    ED Course  Procedures (including critical care time) Labs Review Labs Reviewed  RAPID STREP SCREEN  CULTURE, GROUP A STREP    Imaging Review Dg Chest 2 View  05/17/2014   CLINICAL DATA:  Cough and congestion  EXAM: CHEST  2 VIEW  COMPARISON:  10/26/2011  FINDINGS: Cardiac shadow is stable. Mild increased peribronchial changes are noted without focal confluent infiltrate. No sizable effusion is seen. No bony abnormality is noted.  IMPRESSION: Stable bronchitic changes without acute infiltrate.   Electronically Signed   By: Inez Catalina M.D.   On: 05/17/2014 21:31     EKG Interpretation None      MDM   Final diagnoses:  Viral URI with cough  Acute bronchitis, viral   Maryln Eastham presents with URI symptoms.  Mild tachycardia, low grade fever and coarse breath sounds. Will obtain chest x-ray to rule out pneumonia patient has a history of same. Patient with a history of diabetes, HIV, IV drug use to increase risk for pneumonia.  Pt CXR negative for acute infiltrate. Patient given fluid bolus, pain control, albuterol inhaler reports she is somewhat better. Patients symptoms are consistent with URI, likely viral etiology. Discussed that antibiotics are not indicated for viral infections. Pt will be discharged with  symptomatic treatment.  Verbalizes understanding and is agreeable with plan. Pt is hemodynamically stable & in NAD prior to dc.  I have personally reviewed patient's vitals, nursing note and any pertinent labs or imaging.  I performed an undressed physical exam.    At this time, it has been determined that no acute conditions requiring further emergency intervention. The patient/guardian have been advised of the diagnosis and plan. I reviewed all labs and  imaging including any potential incidental findings. We have discussed signs and symptoms that warrant return to the ED, such as increasing fevers, shortness of breath or changing chest pain.  Patient/guardian has voiced understanding and agreed to follow-up with the PCP or specialist in 3 days.  Vital signs are stable at discharge.   BP 144/95  Pulse 95  Temp(Src) 100 F (37.8 C) (Oral)  Resp 20  Ht 5\' 3"  (1.6 m)  Wt 216 lb (97.977 kg)  BMI 38.27 kg/m2  SpO2 100%          Abigail Butts, PA-C 05/17/14 2316

## 2014-05-19 LAB — CULTURE, GROUP A STREP

## 2014-05-23 ENCOUNTER — Encounter (HOSPITAL_COMMUNITY): Payer: Self-pay | Admitting: Emergency Medicine

## 2014-05-23 ENCOUNTER — Emergency Department (INDEPENDENT_AMBULATORY_CARE_PROVIDER_SITE_OTHER)
Admission: EM | Admit: 2014-05-23 | Discharge: 2014-05-23 | Disposition: A | Discharge status: Home / Self Care | Payer: 59 | Source: Home / Self Care | Attending: Emergency Medicine | Admitting: Emergency Medicine

## 2014-05-23 DIAGNOSIS — J40 Bronchitis, not specified as acute or chronic: Secondary | ICD-10-CM

## 2014-05-23 MED ORDER — HYDROCODONE-HOMATROPINE 5-1.5 MG/5ML PO SYRP: 5.0000 mL | ORAL_SOLUTION | Freq: Four times a day (QID) | ORAL | Status: DC | PRN

## 2014-05-23 MED ORDER — DOXYCYCLINE HYCLATE 100 MG PO CAPS: 100.0000 mg | ORAL_CAPSULE | Freq: Two times a day (BID) | ORAL | Status: DC

## 2014-05-23 MED ORDER — ALBUTEROL SULFATE HFA 108 (90 BASE) MCG/ACT IN AERS: 2.0000 | INHALATION_SPRAY | RESPIRATORY_TRACT | Status: DC | PRN

## 2014-05-23 NOTE — ED Notes (Signed)
C/o no better since visit to ED 8-1; c/o continues to cough, and has lost her voice

## 2014-05-23 NOTE — Discharge Instructions (Signed)
You have bronchitis. We are going to treat with antibiotics since conservative management is not working. Take doxycycline 1 pill twice a day for 10 days. I have refilled the cough syrup.  You can also take a teaspoon of honey as needed for cough. Use the albuterol inhaler as needed for shortness of breath.  I would expect you to start feeling better by Monday. If you are getting worse, having trouble breathing, or start vomiting, please go to the emergency room.

## 2014-05-23 NOTE — ED Provider Notes (Signed)
CSN: [id number]     Arrival date & time 05/23/14  1846 History   First MD Initiated Contact with Patient 05/23/14 2000     Chief Complaint  Patient presents with  . Cough   (Consider location/radiation/quality/duration/timing/severity/associated sxs/prior Treatment) HPI She is a 48 year old woman here for evaluation of cough. Her symptoms started about 12 days ago. Cough is productive of sputum. In the last 2 days she has noticed occasional streaks of blood in the sputum. It is associated with subjective fevers and chills, rhinorrhea, loss of voice, shortness of breath. She was seen and evaluated in the emergency room about one week ago. The chest x-ray at that time was negative for pneumonia. She was discharged with diagnosis of viral URI. She was given an albuterol inhaler in the emergency department, which she states helped but has since run out. She is a never smoker.  Past Medical History  Diagnosis Date  . Hypertension    History reviewed. No pertinent past surgical history. History reviewed. No pertinent family history. History  Substance Use Topics  . Smoking status: Never Smoker   . Smokeless tobacco: Not on file  . Alcohol Use: No   OB History   Grav Para Term Preterm Abortions TAB SAB Ect Mult Living                 Review of Systems  Constitutional: Positive for fever and chills.  HENT: Positive for rhinorrhea and voice change. Negative for congestion, ear pain and trouble swallowing.   Respiratory: Positive for cough and shortness of breath. Negative for wheezing.   Cardiovascular: Positive for chest pain (only with cough).  Gastrointestinal: Negative.   Musculoskeletal: Negative.   Neurological: Negative.     Allergies  Sulfa antibiotics  Home Medications   Prior to Admission medications   Medication Sig Start Date End Date Taking? Authorizing Provider  ibuprofen (ADVIL,MOTRIN) 800 MG tablet Take 1 tablet (800 mg total) by mouth 3 (three) times daily. 05/17/14   Yes Hannah Muthersbaugh, PA-C  valsartan-hydrochlorothiazide (DIOVAN-HCT) 160-25 MG per tablet Take 1 tablet by mouth daily.   Yes Historical Provider, MD  albuterol (PROVENTIL HFA;VENTOLIN HFA) 108 (90 BASE) MCG/ACT inhaler Inhale 1-2 puffs into the lungs every 6 (six) hours as needed for wheezing. 10/26/11 10/25/12  Md Dorie Rank, MD  albuterol (PROVENTIL HFA;VENTOLIN HFA) 108 (90 BASE) MCG/ACT inhaler Inhale 2 puffs into the lungs every 4 (four) hours as needed for wheezing or shortness of breath. 05/23/14   Melony Overly, MD  doxycycline (VIBRAMYCIN) 100 MG capsule Take 1 capsule (100 mg total) by mouth 2 (two) times daily. 05/23/14   Melony Overly, MD  guaiFENesin (MUCINEX) 600 MG 12 hr tablet Take 1,200 mg by mouth 2 (two) times daily.    Historical Provider, MD  guaiFENesin (MUCINEX) 600 MG 12 hr tablet Take 2 tablets (1,200 mg total) by mouth 2 (two) times daily. 05/17/14   Hannah Muthersbaugh, PA-C  HYDROcodone-homatropine (HYCODAN) 5-1.5 MG/5ML syrup Take 5 mLs by mouth every 6 (six) hours as needed for cough. 05/23/14   Melony Overly, MD  mometasone (NASONEX) 50 MCG/ACT nasal spray Place 2 sprays into the nose daily. 05/17/14   Hannah Muthersbaugh, PA-C  sodium chloride (OCEAN) 0.65 % SOLN nasal spray Place 1 spray into the nose as needed. For nasal irritation/congestion    Historical Provider, MD   BP 188/98  Pulse 74  Temp(Src) 98.4 F (36.9 C) (Oral)  Resp 16  SpO2 96% Physical Exam  Constitutional: She is oriented to person, place, and time. She appears well-developed and well-nourished. No distress.  HENT:  Head: Normocephalic and atraumatic.  Right Ear: External ear normal.  Left Ear: External ear normal.  Nose: Mucosal edema and rhinorrhea present. Right sinus exhibits no maxillary sinus tenderness and no frontal sinus tenderness. Left sinus exhibits no maxillary sinus tenderness and no frontal sinus tenderness.  Mouth/Throat: Oropharynx is clear and moist. No oropharyngeal exudate.  Neck:  Normal range of motion. Neck supple.  Cardiovascular: Normal rate, regular rhythm and normal heart sounds.   No murmur heard. Pulmonary/Chest: Effort normal and breath sounds normal. No respiratory distress. She has no wheezes. She has no rales.  Lymphadenopathy:    She has no cervical adenopathy.  Neurological: She is alert and oriented to person, place, and time.  Skin: Skin is warm and dry. No rash noted.    ED Course  Procedures (including critical care time) Labs Review Labs Reviewed - No data to display  Imaging Review No results found.   MDM   1. Bronchitis    Given prolonged symptoms and development of some occasional bloody sputum will treat with doxycycline for 10 days. I suspect the bloody sputum is secondary to the coughing. Chest x-ray in the emergency room did not show any signs of lung mass. No history consistent with pulmonary embolism. I refilled her cough medicine, Hycodan. Also recommended honey for cough. Prescription for albuterol inhaler provided. Reviewed warning signs to go to the emergency room as in after visit summary.    Melony Overly, MD 05/23/14 [phone]

## 2014-08-01 ENCOUNTER — Encounter: Payer: Self-pay | Admitting: Obstetrics

## 2014-08-01 ENCOUNTER — Ambulatory Visit (INDEPENDENT_AMBULATORY_CARE_PROVIDER_SITE_OTHER): Payer: 59 | Admitting: Obstetrics

## 2014-08-01 VITALS — BP 138/62 | HR 91 | Wt 208.0 lb

## 2014-08-01 DIAGNOSIS — B3731 Acute candidiasis of vulva and vagina: Secondary | ICD-10-CM

## 2014-08-01 DIAGNOSIS — Z01419 Encounter for gynecological examination (general) (routine) without abnormal findings: Secondary | ICD-10-CM

## 2014-08-01 DIAGNOSIS — B373 Candidiasis of vulva and vagina: Secondary | ICD-10-CM

## 2014-08-01 DIAGNOSIS — B9689 Other specified bacterial agents as the cause of diseases classified elsewhere: Secondary | ICD-10-CM | POA: Insufficient documentation

## 2014-08-01 DIAGNOSIS — N760 Acute vaginitis: Secondary | ICD-10-CM

## 2014-08-01 MED ORDER — FLUCONAZOLE 150 MG PO TABS: 150.0000 mg | ORAL_TABLET | Freq: Once | ORAL | Status: DC

## 2014-08-01 MED ORDER — METRONIDAZOLE 500 MG PO TABS: 500.0000 mg | ORAL_TABLET | Freq: Two times a day (BID) | ORAL | Status: DC

## 2014-08-01 NOTE — Progress Notes (Signed)
Subjective:     Brooke Hanna is a 48 y.o. female here for a routine exam.  Current complaints: Chronic BV.    Personal health questionnaire:  Is patient Ashkenazi Jewish, have a family history of breast and/or ovarian cancer: no Is there a family history of uterine cancer diagnosed at age < 81, gastrointestinal cancer, urinary tract cancer, family member who is a Field seismologist syndrome-associated carrier: no Is the patient overweight and hypertensive, family history of diabetes, personal history of gestational diabetes or PCOS: no Is patient over 51, have PCOS,  family history of premature CHD under age 37, diabetes, smoke, have hypertension or peripheral artery disease:  no At any time, has a partner hit, kicked or otherwise hurt or frightened you?: no Over the past 2 weeks, have you felt down, depressed or hopeless?: no Over the past 2 weeks, have you felt little interest or pleasure in doing things?:no   Gynecologic History No LMP recorded. Patient is not currently having periods (Reason: Perimenopausal). Contraception: post menopausal status Last Pap: 2014. Results were: normal Last mammogram: 2014. Results were: normal  Obstetric History OB History  No data available    Past Medical History  Diagnosis Date  . Hypertension     History reviewed. No pertinent past surgical history.  Current outpatient prescriptions:albuterol (PROVENTIL HFA;VENTOLIN HFA) 108 (90 BASE) MCG/ACT inhaler, Inhale 2 puffs into the lungs every 4 (four) hours as needed for wheezing or shortness of breath., Disp: 1 Inhaler, Rfl: 0;  ibuprofen (ADVIL,MOTRIN) 800 MG tablet, Take 1 tablet (800 mg total) by mouth 3 (three) times daily., Disp: 21 tablet, Rfl: 0 valsartan-hydrochlorothiazide (DIOVAN-HCT) 160-25 MG per tablet, Take 1 tablet by mouth daily., Disp: , Rfl: ;  albuterol (PROVENTIL HFA;VENTOLIN HFA) 108 (90 BASE) MCG/ACT inhaler, Inhale 1-2 puffs into the lungs every 6 (six) hours as needed for wheezing.,  Disp: 1 Inhaler, Rfl: 0;  doxycycline (VIBRAMYCIN) 100 MG capsule, Take 1 capsule (100 mg total) by mouth 2 (two) times daily., Disp: 20 capsule, Rfl: 0 fluconazole (DIFLUCAN) 150 MG tablet, Take 1 tablet (150 mg total) by mouth once., Disp: 1 tablet, Rfl: 4;  HYDROcodone-homatropine (HYCODAN) 5-1.5 MG/5ML syrup, Take 5 mLs by mouth every 6 (six) hours as needed for cough., Disp: 120 mL, Rfl: 0;  metroNIDAZOLE (FLAGYL) 500 MG tablet, Take 1 tablet (500 mg total) by mouth 2 (two) times daily., Disp: 14 tablet, Rfl: 4 mometasone (NASONEX) 50 MCG/ACT nasal spray, Place 2 sprays into the nose daily., Disp: 17 g, Rfl: 12;  sodium chloride (OCEAN) 0.65 % SOLN nasal spray, Place 1 spray into the nose as needed. For nasal irritation/congestion, Disp: , Rfl:  Allergies  Allergen Reactions  . Sulfa Antibiotics Swelling    History  Substance Use Topics  . Smoking status: Never Smoker   . Smokeless tobacco: Not on file  . Alcohol Use: No     Comment: social    History reviewed. No pertinent family history.    Review of Systems  Constitutional: negative for fatigue and weight loss Respiratory: negative for cough and wheezing Cardiovascular: negative for chest pain, fatigue and palpitations Gastrointestinal: negative for abdominal pain and change in bowel habits Musculoskeletal:negative for myalgias Neurological: negative for gait problems and tremors Behavioral/Psych: negative for abusive relationship, depression Endocrine: negative for temperature intolerance   Genitourinary: positive for chronic BV Integument/breast: negative for breast lump, breast tenderness, nipple discharge and skin lesion(s)    Objective:       BP 138/62  Pulse 91  Wt  208 lb (94.348 kg) General:   alert  Skin:   no rash or abnormalities  Lungs:   clear to auscultation bilaterally  Heart:   regular rate and rhythm, S1, S2 normal, no murmur, click, rub or gallop  Breasts:   normal without suspicious masses, skin or  nipple changes or axillary nodes  Abdomen:  normal findings: no organomegaly, soft, non-tender and no hernia  Pelvis:  External genitalia: normal general appearance Urinary system: urethral meatus normal and bladder without fullness, nontender Vaginal: normal without tenderness, induration or masses Cervix: normal appearance.  Stenotic os.  Friable.  Bled easily with pap. Adnexa: normal bimanual exam Uterus: anteverted and non-tender, normal size   Lab Review Urine pregnancy test Labs reviewed yes Radiologic studies reviewed yes   Assessment:    Healthy female exam.  Chronic BV   Plan:    Education reviewed: calcium supplements, low fat, low cholesterol diet, self breast exams, weight bearing exercise and management of chronic BV.. Mammogram ordered. Follow up in: 4 months.   Meds ordered this encounter  Medications  . metroNIDAZOLE (FLAGYL) 500 MG tablet    Sig: Take 1 tablet (500 mg total) by mouth 2 (two) times daily.    Dispense:  14 tablet    Refill:  4  . fluconazole (DIFLUCAN) 150 MG tablet    Sig: Take 1 tablet (150 mg total) by mouth once.    Dispense:  1 tablet    Refill:  4   Orders Placed This Encounter  Procedures  . WET PREP BY MOLECULAR PROBE  . GC/Chlamydia Probe Amp

## 2014-08-02 LAB — GC/CHLAMYDIA PROBE AMP
CT PROBE, AMP APTIMA: NEGATIVE
GC PROBE AMP APTIMA: NEGATIVE

## 2014-08-02 LAB — WET PREP BY MOLECULAR PROBE
Candida species: NEGATIVE
Gardnerella vaginalis: POSITIVE — AB
Trichomonas vaginosis: NEGATIVE

## 2014-08-04 ENCOUNTER — Ambulatory Visit: Payer: 59 | Admitting: Obstetrics

## 2014-08-05 ENCOUNTER — Other Ambulatory Visit: Payer: Self-pay | Admitting: Obstetrics

## 2014-08-06 LAB — PAP IG AND HPV HIGH-RISK: HPV DNA HIGH RISK: DETECTED — AB

## 2014-10-07 ENCOUNTER — Telehealth: Payer: Self-pay | Admitting: *Deleted

## 2014-10-07 DIAGNOSIS — N390 Urinary tract infection, site not specified: Secondary | ICD-10-CM

## 2014-10-07 MED ORDER — NITROFURANTOIN MONOHYD MACRO 100 MG PO CAPS: 100.0000 mg | ORAL_CAPSULE | Freq: Two times a day (BID) | ORAL | Status: DC

## 2014-10-07 NOTE — Telephone Encounter (Signed)
Patient advised of colposcopy results. While on the phone patient states she is having symptoms of a UTI. Per nursing protocol Macrobid sent to the pharmacy.

## 2014-10-22 ENCOUNTER — Ambulatory Visit (INDEPENDENT_AMBULATORY_CARE_PROVIDER_SITE_OTHER): Payer: BLUE CROSS/BLUE SHIELD | Admitting: Obstetrics

## 2014-10-22 ENCOUNTER — Other Ambulatory Visit: Payer: Self-pay | Admitting: Obstetrics

## 2014-10-22 ENCOUNTER — Encounter: Payer: Self-pay | Admitting: Obstetrics

## 2014-10-22 VITALS — BP 155/95 | HR 71 | Temp 98.0°F | Wt 219.0 lb

## 2014-10-22 DIAGNOSIS — Z01818 Encounter for other preprocedural examination: Secondary | ICD-10-CM

## 2014-10-22 DIAGNOSIS — IMO0002 Reserved for concepts with insufficient information to code with codable children: Secondary | ICD-10-CM

## 2014-10-22 DIAGNOSIS — N760 Acute vaginitis: Secondary | ICD-10-CM

## 2014-10-22 DIAGNOSIS — B9689 Other specified bacterial agents as the cause of diseases classified elsewhere: Secondary | ICD-10-CM

## 2014-10-22 DIAGNOSIS — R896 Abnormal cytological findings in specimens from other organs, systems and tissues: Secondary | ICD-10-CM

## 2014-10-22 DIAGNOSIS — Z3202 Encounter for pregnancy test, result negative: Secondary | ICD-10-CM

## 2014-10-22 LAB — POCT URINE PREGNANCY: PREG TEST UR: NEGATIVE

## 2014-10-22 MED ORDER — TINIDAZOLE 500 MG PO TABS: 1000.0000 mg | ORAL_TABLET | Freq: Every day | ORAL | Status: DC

## 2014-10-22 NOTE — Progress Notes (Signed)
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 The prior pap showed: unknown.  Prior cervical/vaginal disease: normal exam without visible pathology. Prior cervical treatment: no treatment.  Procedure Details  The risks and benefits of the procedure and Written informed consent obtained.  A time-out was performed confirming the patient, procedure and allergy status  Speculum placed in vagina and excellent visualization of cervix achieved, cervix swabbed x 3 with acetic acid solution.  Findings: Cervix: no visible lesions but cervix stenosed; endocervical curettage performed, cervical biopsies taken at 12, 9, 6, 3 o'clock, specimen labelled and sent to pathology and hemostasis achieved with silver nitrate.   Vaginal inspection: normal without visible lesions. Vulvar colposcopy: vulvar colposcopy not performed.   Physical Exam   Specimens: ECC and Cervical Biopsies  Complications: none.  Plan: Specimens labelled and sent to Pathology. Will base further treatment on Pathology findings. Post biopsy instructions given to patient. Return to discuss Pathology results in 2 weeks.

## 2014-10-25 LAB — SURESWAB, VAGINOSIS/VAGINITIS PLUS
Atopobium vaginae: NOT DETECTED Log (cells/mL)
C. ALBICANS, DNA: NOT DETECTED
C. GLABRATA, DNA: NOT DETECTED
C. PARAPSILOSIS, DNA: NOT DETECTED
C. TROPICALIS, DNA: NOT DETECTED
C. trachomatis RNA, TMA: NOT DETECTED
Gardnerella vaginalis: 6.6 Log (cells/mL)
LACTOBACILLUS SPECIES: NOT DETECTED Log (cells/mL)
MEGASPHAERA SPECIES: NOT DETECTED Log (cells/mL)
N. GONORRHOEAE RNA, TMA: NOT DETECTED
T. vaginalis RNA, QL TMA: NOT DETECTED

## 2014-10-26 ENCOUNTER — Other Ambulatory Visit: Payer: Self-pay | Admitting: Obstetrics

## 2014-10-26 DIAGNOSIS — B9689 Other specified bacterial agents as the cause of diseases classified elsewhere: Secondary | ICD-10-CM

## 2014-10-26 DIAGNOSIS — N760 Acute vaginitis: Principal | ICD-10-CM

## 2014-10-26 MED ORDER — METRONIDAZOLE 500 MG PO TABS: 500.0000 mg | ORAL_TABLET | Freq: Two times a day (BID) | ORAL | Status: DC

## 2014-10-29 ENCOUNTER — Other Ambulatory Visit: Payer: Self-pay | Admitting: *Deleted

## 2014-10-29 DIAGNOSIS — Z1231 Encounter for screening mammogram for malignant neoplasm of breast: Secondary | ICD-10-CM

## 2014-11-11 ENCOUNTER — Ambulatory Visit
Admission: RE | Admit: 2014-11-11 | Discharge: 2014-11-11 | Disposition: A | Discharge status: Home / Self Care | Payer: BC Managed Care – PPO | Source: Ambulatory Visit | Attending: Obstetrics | Admitting: Obstetrics

## 2014-11-11 DIAGNOSIS — N6489 Other specified disorders of breast: Secondary | ICD-10-CM

## 2014-11-11 DIAGNOSIS — Z1231 Encounter for screening mammogram for malignant neoplasm of breast: Secondary | ICD-10-CM

## 2014-11-18 ENCOUNTER — Other Ambulatory Visit: Payer: Self-pay | Admitting: Obstetrics

## 2014-11-18 DIAGNOSIS — Z1231 Encounter for screening mammogram for malignant neoplasm of breast: Secondary | ICD-10-CM

## 2014-11-18 DIAGNOSIS — N6489 Other specified disorders of breast: Secondary | ICD-10-CM

## 2014-12-03 ENCOUNTER — Other Ambulatory Visit: Payer: BC Managed Care – PPO

## 2014-12-04 ENCOUNTER — Ambulatory Visit: Payer: 59 | Admitting: Obstetrics

## 2014-12-05 ENCOUNTER — Ambulatory Visit
Admission: RE | Admit: 2014-12-05 | Discharge: 2014-12-05 | Disposition: A | Discharge status: Home / Self Care | Payer: BC Managed Care – PPO | Source: Ambulatory Visit | Attending: Obstetrics | Admitting: Obstetrics

## 2014-12-05 DIAGNOSIS — Z1231 Encounter for screening mammogram for malignant neoplasm of breast: Secondary | ICD-10-CM

## 2015-04-07 ENCOUNTER — Ambulatory Visit: Payer: BLUE CROSS/BLUE SHIELD | Admitting: Obstetrics

## 2015-05-12 ENCOUNTER — Other Ambulatory Visit: Payer: Self-pay | Admitting: Obstetrics

## 2015-05-12 DIAGNOSIS — N6011 Diffuse cystic mastopathy of right breast: Secondary | ICD-10-CM

## 2015-06-08 ENCOUNTER — Other Ambulatory Visit: Payer: BC Managed Care – PPO

## 2015-07-13 ENCOUNTER — Ambulatory Visit
Admission: RE | Admit: 2015-07-13 | Discharge: 2015-07-13 | Disposition: A | Discharge status: Home / Self Care | Payer: BC Managed Care – PPO | Source: Ambulatory Visit | Attending: Obstetrics | Admitting: Obstetrics

## 2015-07-13 DIAGNOSIS — N6011 Diffuse cystic mastopathy of right breast: Secondary | ICD-10-CM

## 2015-07-28 IMAGING — MG MM SCREEN MAMMOGRAM BILATERAL
8 series · 8 of 8 positions shown · non-contrast
Comparison: Mammography with tomosynthesis and right breast
ultrasound 08/27/2013.

CLINICAL DATA: 1+ year interval followup of likely benign focal
asymmetry in the upper inner quadrant of the right breast initially
evaluated at [facility]. There was no sonographic correlate
for the right breast asymmetry and a benign lymph node was
identified in the upper outer quadrant of the right breast on that
evaluation. Annual evaluation, left breast.

[R CC (1 of 3)]
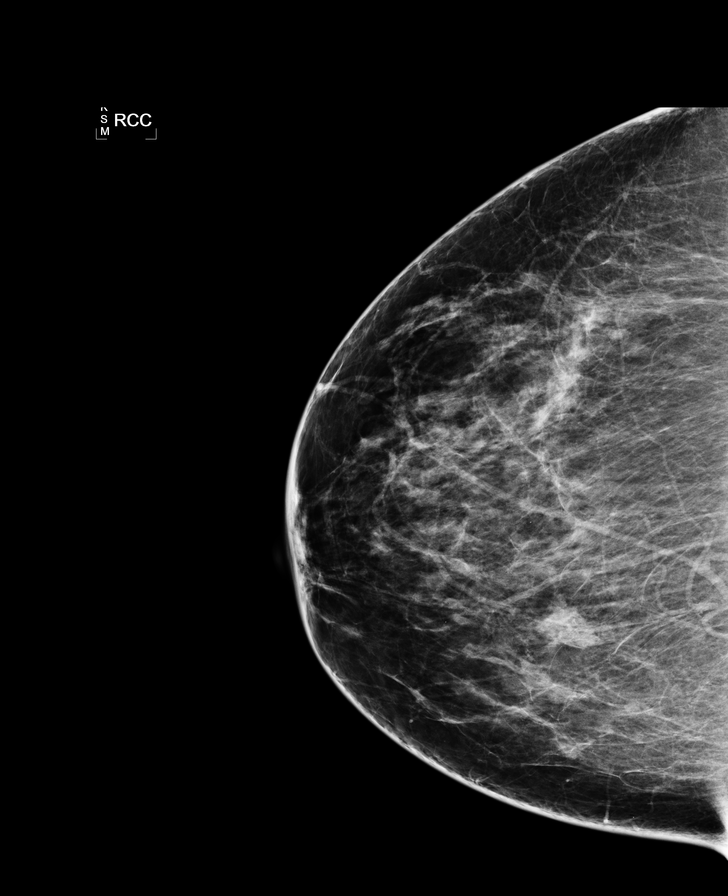

[L CC]
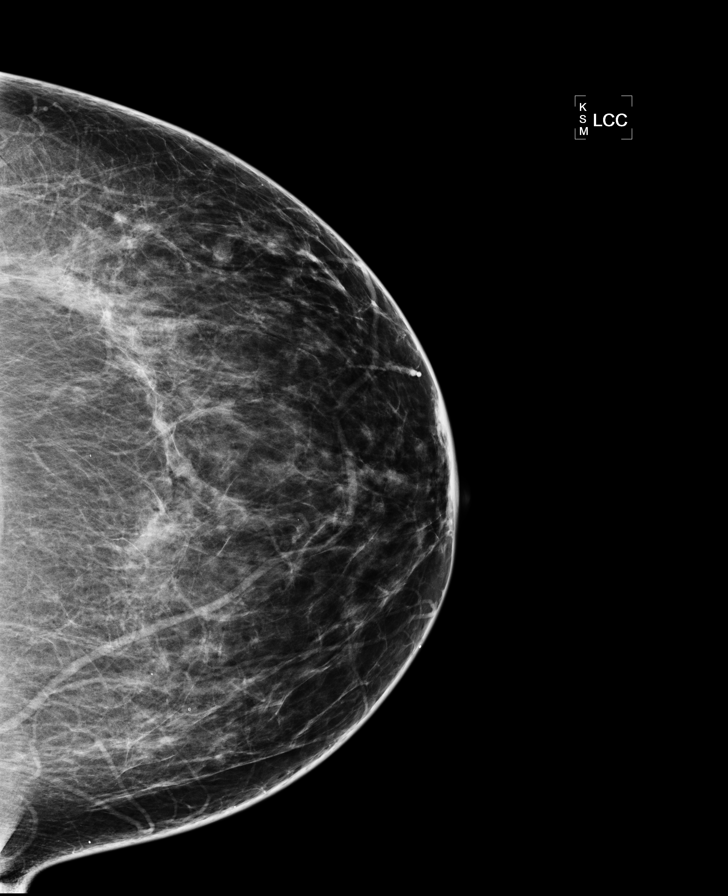

[L MLO (1 of 2)]
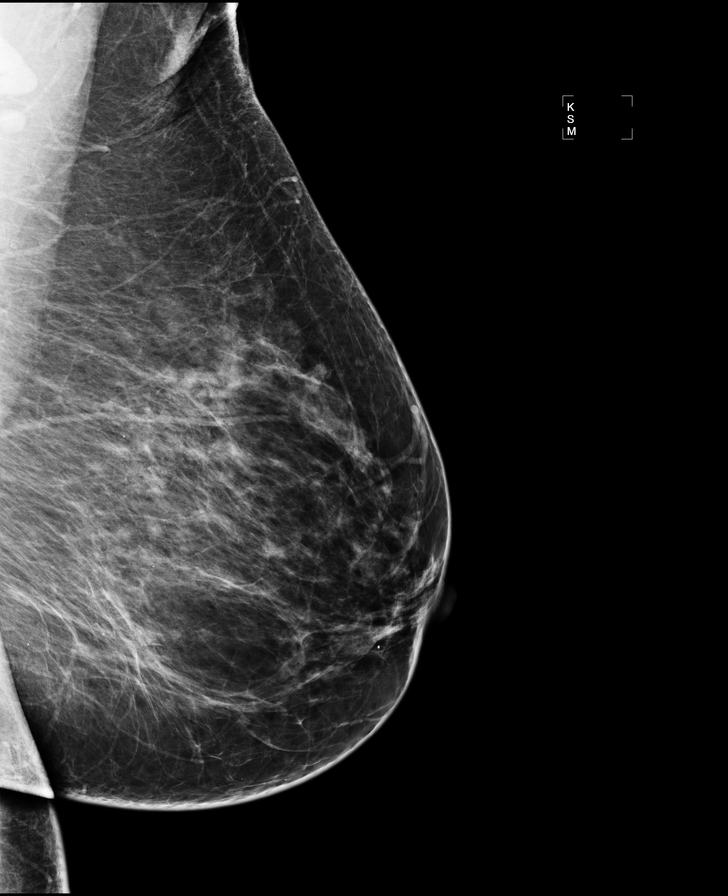

[R MLO (1 of 2)]
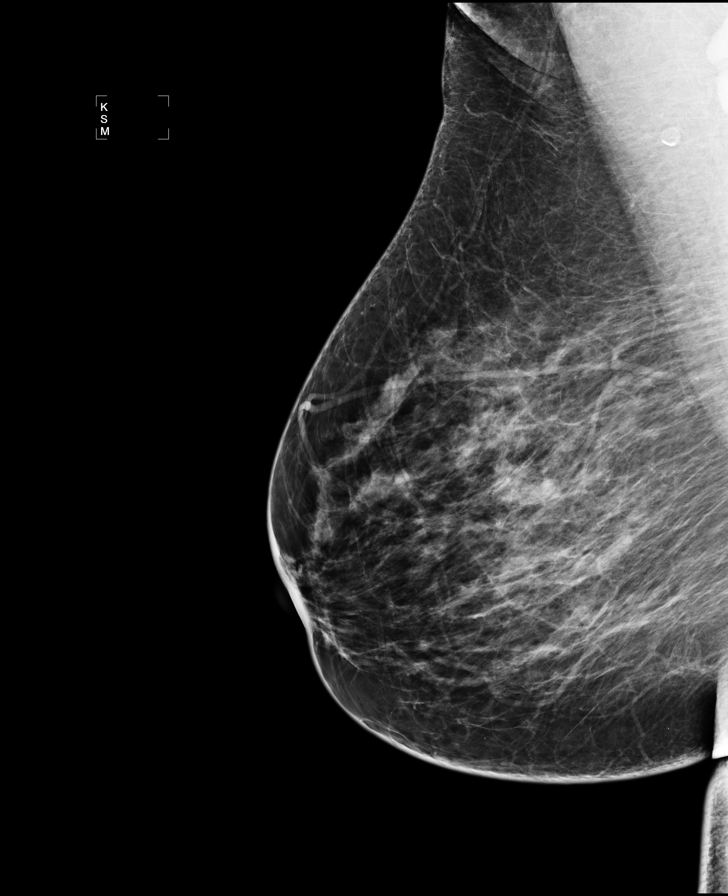

[L MLO (2 of 2)]
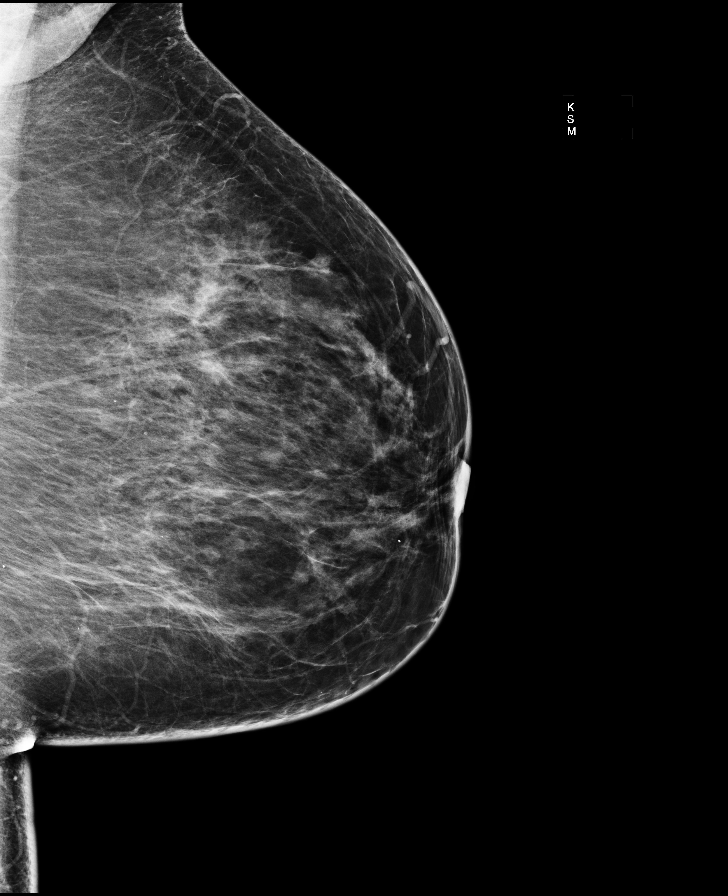

[R CC (2 of 3)]
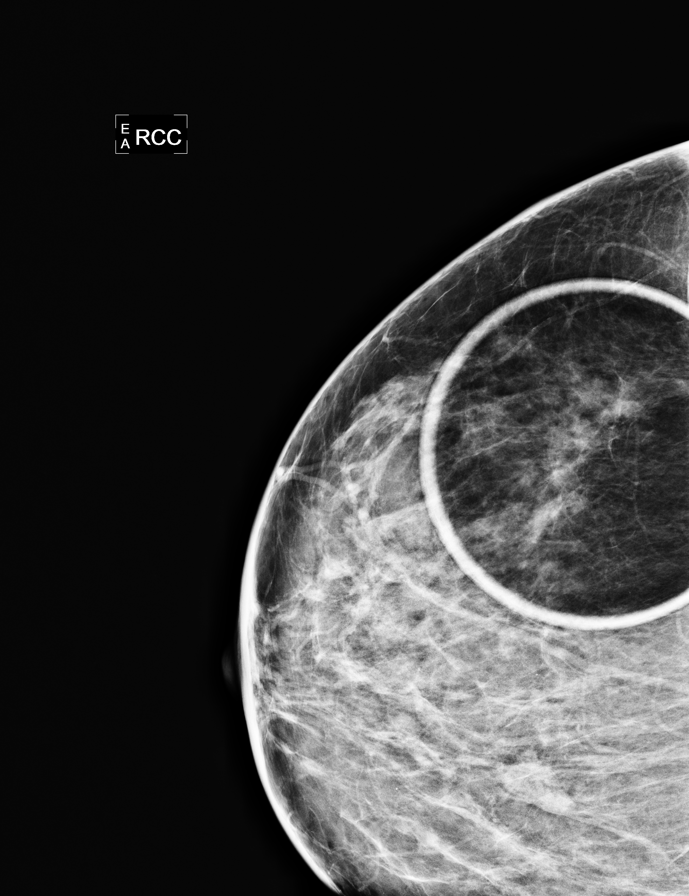

[R CC (3 of 3)]
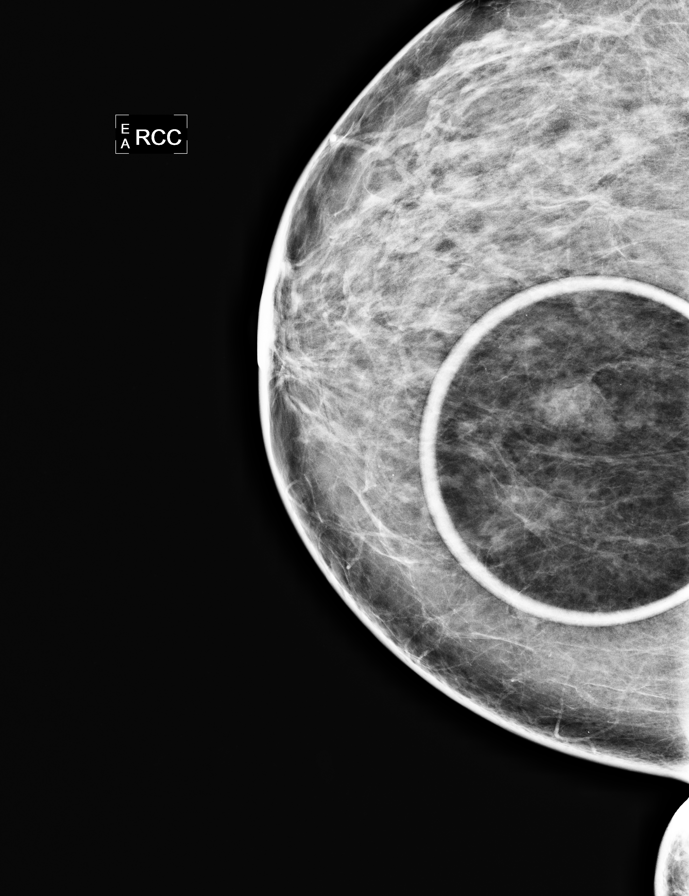

[R MLO (2 of 2)]
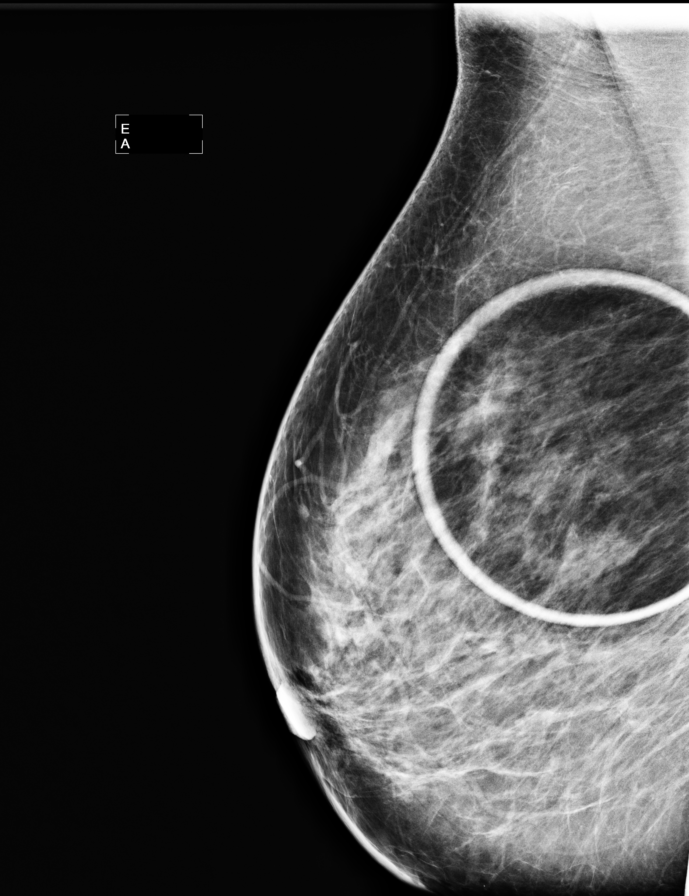

[8 of 8 positions shown; findings below may reference images not displayed]

Please note that the current mammograms were initially performed as
a screening study 11/11/2014, but then it was discovered that she
needed followup of the focal asymmetry in the right breast. She
returned today, 12/05/2014, for the diagnostic spot images.

EXAM:
DIGITAL DIAGNOSTIC BILATERAL MAMMOGRAM WITH CAD

ULTRASOUND RIGHT BREAST
ACR Breast Density Category b: There are scattered areas of
fibroglandular density.
FINDINGS: CC and MLO views of both breasts and spot compression views of the
areas of concern in the upper inner and upper outer right breast
were obtained. The focal asymmetry in the upper inner quadrant of
the right breast is unchanged in size and appearance since the prior
mammogram, and on the prior tomosynthesis images, the margins are
circumscribed and there is fat interspersed within the focus. The
intramammary lymph node in the upper outer right breast is less well
visualized currently. No new or suspicious findings in the right
breast.

No findings suspicious for malignancy in the left breast.

Mammographic images were processed with CAD.

On physical exam, there is no palpable abnormality in the upper
inner quadrant of the right breast.

Targeted ultrasound is performed, showing a focus of dense tissue of
mixed attenuation at the 1 o'clock position of the right breast
approximately 8 cm from the nipple measuring approximately 1.0 x
x 1.0 cm, corresponding to the area of concern on mammography. While
there is acoustic shadowing with use of the 13.5 MHz transducer,
there is no shadowing when using the 9 MHz transducer and, in fact,
slight acoustic enhancement is present.
IMPRESSION: 1. Likely benign focal fibrocystic changes or apocrine metaplasia in
the upper inner quadrant of the right breast accounting for the
focal asymmetry on mammography.
2. No mammographic evidence of malignancy, left breast.

RECOMMENDATION:
Diagnostic right mammogram and right breast ultrasound in 6 months
to confirm stability. (This will take the patient to 18 months after
the original outside mammogram).

I have discussed the findings and recommendations with the patient.
Results were also provided in writing at the conclusion of the
visit. If applicable, a reminder letter will be sent to the patient
regarding the next appointment.

BI-RADS CATEGORY  3: Probably benign.

## 2015-08-03 ENCOUNTER — Ambulatory Visit: Payer: 59 | Admitting: Obstetrics

## 2015-11-04 ENCOUNTER — Telehealth: Payer: Self-pay | Admitting: *Deleted

## 2015-11-04 NOTE — Telephone Encounter (Signed)
Patient is requesting a refill on her BV medication- per her chart she was given 2 RF at the beginning of the month. 2:30 LM on VM- please contact pharmacy for refill should have 2.

## 2015-11-05 ENCOUNTER — Other Ambulatory Visit: Payer: Self-pay | Admitting: Obstetrics

## 2015-11-05 NOTE — Telephone Encounter (Signed)
Please advise on refill.

## 2015-11-17 ENCOUNTER — Other Ambulatory Visit: Payer: Self-pay | Admitting: Obstetrics

## 2015-11-17 DIAGNOSIS — N760 Acute vaginitis: Principal | ICD-10-CM

## 2015-11-17 DIAGNOSIS — B9689 Other specified bacterial agents as the cause of diseases classified elsewhere: Secondary | ICD-10-CM

## 2015-11-17 MED ORDER — TINIDAZOLE 500 MG PO TABS: ORAL_TABLET | ORAL | Status: DC

## 2015-11-17 NOTE — Progress Notes (Signed)
Rx has been sent by provider  

## 2016-01-16 DEATH — deceased

## 2016-04-06 ENCOUNTER — Telehealth: Payer: Self-pay | Admitting: *Deleted

## 2016-04-06 DIAGNOSIS — N760 Acute vaginitis: Principal | ICD-10-CM

## 2016-04-06 DIAGNOSIS — B3731 Acute candidiasis of vulva and vagina: Secondary | ICD-10-CM

## 2016-04-06 DIAGNOSIS — B373 Candidiasis of vulva and vagina: Secondary | ICD-10-CM

## 2016-04-06 DIAGNOSIS — B9689 Other specified bacterial agents as the cause of diseases classified elsewhere: Secondary | ICD-10-CM

## 2016-04-06 MED ORDER — FLUCONAZOLE 150 MG PO TABS: 150.0000 mg | ORAL_TABLET | Freq: Once | ORAL | Status: DC

## 2016-04-06 MED ORDER — METRONIDAZOLE 500 MG PO TABS: 500.0000 mg | ORAL_TABLET | Freq: Two times a day (BID) | ORAL | Status: DC

## 2016-04-06 NOTE — Telephone Encounter (Signed)
Patient wants a refill on her Flagyl and Diflucan. Patient is actually due a 6 month pap. 3:01- Patient scheduled for her appointment. Rx sent to her pharmacy for her chronic treatment per Dr Jodi Mourning ok.

## 2016-04-20 ENCOUNTER — Ambulatory Visit: Payer: BC Managed Care – PPO | Admitting: Obstetrics

## 2016-04-25 ENCOUNTER — Ambulatory Visit (INDEPENDENT_AMBULATORY_CARE_PROVIDER_SITE_OTHER): Payer: BC Managed Care – PPO | Admitting: Obstetrics

## 2016-04-25 ENCOUNTER — Encounter: Payer: Self-pay | Admitting: Obstetrics

## 2016-04-25 VITALS — BP 148/90 | HR 76 | Temp 98.5°F | Wt 248.0 lb

## 2016-04-25 DIAGNOSIS — N760 Acute vaginitis: Secondary | ICD-10-CM | POA: Diagnosis not present

## 2016-04-25 DIAGNOSIS — R896 Abnormal cytological findings in specimens from other organs, systems and tissues: Secondary | ICD-10-CM | POA: Diagnosis not present

## 2016-04-25 DIAGNOSIS — IMO0002 Reserved for concepts with insufficient information to code with codable children: Secondary | ICD-10-CM

## 2016-04-27 ENCOUNTER — Encounter: Payer: Self-pay | Admitting: Obstetrics

## 2016-04-27 LAB — PAP IG AND HPV HIGH-RISK
HPV, HIGH-RISK: POSITIVE — AB
PAP Smear Comment: 0

## 2016-04-27 NOTE — Progress Notes (Signed)
Patient ID: Brooke Hanna, female   DOB: 1966-10-03, 50 y.o.   MRN: [id number]  Chief Complaint  Patient presents with  . Follow-up    Repeat pap-6 month    HPI Brooke Hanna is a 50 y.o. female.  LGSIL.  HPI  Past Medical History  Diagnosis Date  . Hypertension     History reviewed. No pertinent past surgical history.  History reviewed. No pertinent family history.  Social History Social History  Substance Use Topics  . Smoking status: Never Smoker   . Smokeless tobacco: None  . Alcohol Use: 0.0 oz/week    0 Standard drinks or equivalent per week     Comment: social    Allergies  Allergen Reactions  . Sulfa Antibiotics Swelling    Current Outpatient Prescriptions  Medication Sig Dispense Refill  . ibuprofen (ADVIL,MOTRIN) 800 MG tablet Take 1 tablet (800 mg total) by mouth 3 (three) times daily. 21 tablet 0  . valsartan-hydrochlorothiazide (DIOVAN-HCT) 160-25 MG per tablet Take 1 tablet by mouth daily.    Marland Kitchen doxycycline (VIBRAMYCIN) 100 MG capsule Take 1 capsule (100 mg total) by mouth 2 (two) times daily. (Patient not taking: Reported on 10/22/2014) 20 capsule 0  . fluconazole (DIFLUCAN) 150 MG tablet Take 1 tablet (150 mg total) by mouth once. (Patient not taking: Reported on 04/25/2016) 1 tablet 4  . metroNIDAZOLE (FLAGYL) 500 MG tablet Take 1 tablet (500 mg total) by mouth 2 (two) times daily. 14 tablet 4   No current facility-administered medications for this visit.    Review of Systems Review of Systems Constitutional: negative for fatigue and weight loss Respiratory: negative for cough and wheezing Cardiovascular: negative for chest pain, fatigue and palpitations Gastrointestinal: negative for abdominal pain and change in bowel habits Genitourinary:negative Integument/breast: negative for nipple discharge Musculoskeletal:negative for myalgias Neurological: negative for gait problems and tremors Behavioral/Psych: negative for abusive relationship,  depression Endocrine: negative for temperature intolerance     Blood pressure 148/90, pulse 76, temperature 98.5 F (36.9 C), temperature source Oral, weight 248 lb (112.492 kg).  Physical Exam Physical Exam           General:  Alert and no distress Abdomen:  normal findings: no organomegaly, soft, non-tender and no hernia  Pelvis:  External genitalia: normal general appearance Urinary system: urethral meatus normal and bladder without fullness, nontender Vaginal: normal without tenderness, induration or masses Cervix: normal appearance.  Pap smear done. Adnexa: normal bimanual exam Uterus: anteverted and non-tender, normal size      Data Reviewed Labs Pathology  Assessment     LGSIL     Plan    Repeat pap 6 months   No orders of the defined types were placed in this encounter.   No orders of the defined types were placed in this encounter.

## 2016-04-28 ENCOUNTER — Other Ambulatory Visit: Payer: Self-pay | Admitting: Obstetrics

## 2016-04-28 DIAGNOSIS — B373 Candidiasis of vulva and vagina: Secondary | ICD-10-CM

## 2016-04-28 DIAGNOSIS — B3731 Acute candidiasis of vulva and vagina: Secondary | ICD-10-CM

## 2016-04-28 LAB — NUSWAB VG, CANDIDA 6SP
CANDIDA GLABRATA, NAA: NEGATIVE
CANDIDA KRUSEI, NAA: NEGATIVE
CANDIDA LUSITANIAE, NAA: NEGATIVE
CANDIDA TROPICALIS, NAA: NEGATIVE
Candida albicans, NAA: POSITIVE — AB
Candida parapsilosis, NAA: NEGATIVE
TRICH VAG BY NAA: NEGATIVE

## 2016-04-28 MED ORDER — FLUCONAZOLE 150 MG PO TABS: 150.0000 mg | ORAL_TABLET | Freq: Once | ORAL | Status: DC

## 2017-10-18 ENCOUNTER — Other Ambulatory Visit: Payer: Self-pay

## 2017-10-18 DIAGNOSIS — N760 Acute vaginitis: Principal | ICD-10-CM

## 2017-10-18 DIAGNOSIS — B9689 Other specified bacterial agents as the cause of diseases classified elsewhere: Secondary | ICD-10-CM

## 2017-10-18 NOTE — Telephone Encounter (Signed)
Returned call and patient wants a refill on flagyl, pt would also wants to know if she needs an annual every 2 years or every year, advised I would route to provider for review.

## 2017-10-19 ENCOUNTER — Telehealth: Payer: Self-pay

## 2017-10-19 MED ORDER — METRONIDAZOLE 500 MG PO TABS: 500.0000 mg | ORAL_TABLET | Freq: Two times a day (BID) | ORAL | 4 refills | Status: DC

## 2017-10-19 NOTE — Telephone Encounter (Signed)
Flagyl Rx She needs an Annual every year.

## 2017-10-19 NOTE — Telephone Encounter (Signed)
Called and advised that pt needs annual every year and that provider sent rx, pt stated that she would call back to set appt.

## 2017-11-16 ENCOUNTER — Ambulatory Visit: Payer: BC Managed Care – PPO | Admitting: Obstetrics

## 2017-11-20 ENCOUNTER — Encounter: Payer: Self-pay | Admitting: Obstetrics

## 2017-11-20 ENCOUNTER — Ambulatory Visit (INDEPENDENT_AMBULATORY_CARE_PROVIDER_SITE_OTHER): Payer: BC Managed Care – PPO | Admitting: Obstetrics

## 2017-11-20 ENCOUNTER — Other Ambulatory Visit: Payer: Self-pay | Admitting: Obstetrics

## 2017-11-20 VITALS — BP 135/86 | HR 63 | Wt 234.6 lb

## 2017-11-20 DIAGNOSIS — Z113 Encounter for screening for infections with a predominantly sexual mode of transmission: Secondary | ICD-10-CM

## 2017-11-20 DIAGNOSIS — Z124 Encounter for screening for malignant neoplasm of cervix: Secondary | ICD-10-CM | POA: Diagnosis not present

## 2017-11-20 DIAGNOSIS — Z6841 Body Mass Index (BMI) 40.0 and over, adult: Secondary | ICD-10-CM

## 2017-11-20 DIAGNOSIS — Z01419 Encounter for gynecological examination (general) (routine) without abnormal findings: Secondary | ICD-10-CM

## 2017-11-20 DIAGNOSIS — Z1151 Encounter for screening for human papillomavirus (HPV): Secondary | ICD-10-CM

## 2017-11-20 DIAGNOSIS — Z1239 Encounter for other screening for malignant neoplasm of breast: Secondary | ICD-10-CM

## 2017-11-20 DIAGNOSIS — Z1211 Encounter for screening for malignant neoplasm of colon: Secondary | ICD-10-CM

## 2017-11-20 DIAGNOSIS — K921 Melena: Secondary | ICD-10-CM

## 2017-11-20 DIAGNOSIS — N6489 Other specified disorders of breast: Secondary | ICD-10-CM

## 2017-11-20 NOTE — Progress Notes (Signed)
Patient is in the office for annual exam, last pap 04-25-2016.

## 2017-11-20 NOTE — Progress Notes (Signed)
Subjective:        Brooke Hanna is a 52 y.o. female here for a routine exam.  Current complaints: Blood in stools..    Personal health questionnaire:  Is patient Ashkenazi Jewish, have a family history of breast and/or ovarian cancer: no Is there a family history of uterine cancer diagnosed at age < 32, gastrointestinal cancer, urinary tract cancer, family member who is a Field seismologist syndrome-associated carrier: no Is the patient overweight and hypertensive, family history of diabetes, personal history of gestational diabetes, preeclampsia or PCOS: no Is patient over 32, have PCOS,  family history of premature CHD under age 29, diabetes, smoke, have hypertension or peripheral artery disease:  no At any time, has a partner hit, kicked or otherwise hurt or frightened you?: no Over the past 2 weeks, have you felt down, depressed or hopeless?: no Over the past 2 weeks, have you felt little interest or pleasure in doing things?:no   Gynecologic History No LMP recorded. Patient is not currently having periods (Reason: Perimenopausal). Contraception: post menopausal status Last Pap: 2017. 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 Results were: normal  Obstetric History OB History  Gravida Para Term Preterm AB Living  3       1 2   SAB TAB Ectopic Multiple Live Births  1       2    # Outcome Date GA Lbr Len/2nd Weight Sex Delivery Anes PTL Lv  3 Gravida 11/22/89    M Vag-Spont   LIV  2 Gravida 09/17/81    M Vag-Spont   LIV  1 SAB               Past Medical History:  Diagnosis Date  . Hypertension     Past Surgical History:  Procedure Laterality Date  . TUBAL LIGATION       Current Outpatient Medications:  .  valsartan-hydrochlorothiazide (DIOVAN-HCT) 160-25 MG per tablet, Take 1 tablet by mouth daily., Disp: , Rfl:  .  doxycycline (VIBRAMYCIN) 100 MG capsule, Take 1 capsule (100 mg total) by mouth 2 (two) times daily. (Patient not taking: Reported on 10/22/2014), Disp:  20 capsule, Rfl: 0 .  fluconazole (DIFLUCAN) 150 MG tablet, Take 1 tablet (150 mg total) by mouth once. (Patient not taking: Reported on 11/20/2017), Disp: 1 tablet, Rfl: 4 .  ibuprofen (ADVIL,MOTRIN) 800 MG tablet, Take 1 tablet (800 mg total) by mouth 3 (three) times daily. (Patient not taking: Reported on 11/20/2017), Disp: 21 tablet, Rfl: 0 .  metroNIDAZOLE (FLAGYL) 500 MG tablet, Take 1 tablet (500 mg total) by mouth 2 (two) times daily. (Patient not taking: Reported on 11/20/2017), Disp: 14 tablet, Rfl: 4 Allergies  Allergen Reactions  . Sulfa Antibiotics Swelling    Social History   Tobacco Use  . Smoking status: Never Smoker  . Smokeless tobacco: Never Used  Substance Use Topics  . Alcohol use: Yes    Alcohol/week: 0.0 oz    Comment: social    Family History  Problem Relation Age of Onset  . Breast cancer Maternal Aunt       Review of Systems  Constitutional: negative for fatigue and weight loss Respiratory: negative for cough and wheezing Cardiovascular: negative for chest pain, fatigue and palpitations Gastrointestinal: positive for observation of blood in stools Musculoskeletal:negative for myalgias Neurological: negative for gait problems and tremors Behavioral/Psych: negative for abusive relationship, depression Endocrine: negative for temperature intolerance    Genitourinary:negative for abnormal menstrual periods, genital lesions, hot flashes, sexual problems and  vaginal discharge Integument/breast: negative for breast lump, breast tenderness, nipple discharge and skin lesion(s)    Objective:       BP 135/86   Pulse 63   Wt 234 lb 9.6 oz (106.4 kg)   BMI 41.56 kg/m  General:   alert  Skin:   no rash or abnormalities  Lungs:   clear to auscultation bilaterally  Heart:   regular rate and rhythm, S1, S2 normal, no murmur, click, rub or gallop  Breasts:   normal without suspicious masses, skin or nipple changes or axillary nodes  Abdomen:  normal findings: no  organomegaly, soft, non-tender and no hernia  Pelvis:  External genitalia: normal general appearance Urinary system: urethral meatus normal and bladder without fullness, nontender Vaginal: normal without tenderness, induration or masses Cervix: normal appearance Adnexa: normal bimanual exam Uterus: anteverted and non-tender, normal size                             Rectal Exam:  Normal exam   Lab Review Urine pregnancy test Labs reviewed yes Radiologic studies reviewed yes  50% of 20 min visit spent on counseling and coordination of care.   Assessment:     1. Encounter for routine gynecological examination with Papanicolaou smear of cervix Rx: - Cytology - PAP - Cervicovaginal ancillary only  2. Class 3 severe obesity due to excess calories without serious comorbidity with body mass index (BMI) of 40.0 to 44.9 in adult [facility]) - recommended a program of caloric reduction, exercise and behavioral modification  3. Hematochezia - referred to Gastroenterology  4. Screening for colon cancer Rx: - Ambulatory referral to Gastroenterology  5. Screening breast examination - Mammography ordered   Plan:    Education reviewed: calcium supplements, depression evaluation, low fat, low cholesterol diet, safe sex/STD prevention, self breast exams and weight bearing exercise. Mammogram ordered. Follow up in: 1 year. Colonoscopy ordered   No orders of the defined types were placed in this encounter.  Orders Placed This Encounter  Procedures  . Ambulatory referral to Gastroenterology    Referral Priority:   Routine    Referral Type:   Consultation    Referral Reason:   Specialty Services Required    Requested Specialty:   Gastroenterology    Number of Visits Requested:   1    Shelly Bombard MD

## 2017-11-21 ENCOUNTER — Encounter: Payer: Self-pay | Admitting: Gastroenterology

## 2017-11-21 ENCOUNTER — Other Ambulatory Visit: Payer: Self-pay | Admitting: Obstetrics

## 2017-11-21 LAB — CERVICOVAGINAL ANCILLARY ONLY
BACTERIAL VAGINITIS: POSITIVE — AB
CANDIDA VAGINITIS: NEGATIVE
Trichomonas: NEGATIVE

## 2017-11-22 LAB — CYTOLOGY - PAP
DIAGNOSIS: NEGATIVE
HPV: NOT DETECTED

## 2017-11-27 ENCOUNTER — Ambulatory Visit
Admission: RE | Admit: 2017-11-27 | Discharge: 2017-11-27 | Disposition: A | Discharge status: Home / Self Care | Payer: BC Managed Care – PPO | Source: Ambulatory Visit | Attending: Obstetrics | Admitting: Obstetrics

## 2017-11-27 ENCOUNTER — Ambulatory Visit: Payer: BC Managed Care – PPO

## 2017-11-27 DIAGNOSIS — N6489 Other specified disorders of breast: Secondary | ICD-10-CM

## 2018-01-12 ENCOUNTER — Ambulatory Visit (AMBULATORY_SURGERY_CENTER): Payer: Self-pay

## 2018-01-12 ENCOUNTER — Other Ambulatory Visit: Payer: Self-pay

## 2018-01-12 ENCOUNTER — Encounter: Payer: Self-pay | Admitting: Gastroenterology

## 2018-01-12 VITALS — Ht 63.0 in | Wt 231.0 lb

## 2018-01-12 DIAGNOSIS — Z1211 Encounter for screening for malignant neoplasm of colon: Secondary | ICD-10-CM

## 2018-01-12 MED ORDER — NA SULFATE-K SULFATE-MG SULF 17.5-3.13-1.6 GM/177ML PO SOLN: 1.0000 | Freq: Once | ORAL | 0 refills | Status: AC

## 2018-01-12 NOTE — Progress Notes (Signed)
Denies allergies to eggs or soy products. Denies complication of anesthesia or sedation. Denies use of weight loss medication. Denies use of O2.   Emmi instructions declined.  

## 2018-01-26 ENCOUNTER — Other Ambulatory Visit: Payer: Self-pay

## 2018-01-26 ENCOUNTER — Ambulatory Visit (AMBULATORY_SURGERY_CENTER): Payer: BC Managed Care – PPO | Admitting: Gastroenterology

## 2018-01-26 ENCOUNTER — Encounter: Payer: Self-pay | Admitting: Gastroenterology

## 2018-01-26 VITALS — BP 119/71 | HR 55 | Temp 96.9°F | Resp 16 | Ht 63.0 in | Wt 231.0 lb

## 2018-01-26 DIAGNOSIS — Z1211 Encounter for screening for malignant neoplasm of colon: Secondary | ICD-10-CM | POA: Diagnosis present

## 2018-01-26 DIAGNOSIS — Z1212 Encounter for screening for malignant neoplasm of rectum: Secondary | ICD-10-CM

## 2018-01-26 DIAGNOSIS — D123 Benign neoplasm of transverse colon: Secondary | ICD-10-CM

## 2018-01-26 DIAGNOSIS — K635 Polyp of colon: Secondary | ICD-10-CM

## 2018-01-26 MED ORDER — SODIUM CHLORIDE 0.9 % IV SOLN: 500.0000 mL | Freq: Once | INTRAVENOUS | Status: DC

## 2018-01-26 NOTE — Progress Notes (Signed)
Called to room to assist during endoscopic procedure.  Patient ID and intended procedure confirmed with present staff. Received instructions for my participation in the procedure from the performing physician.  

## 2018-01-26 NOTE — Patient Instructions (Signed)
YOU HAD AN ENDOSCOPIC PROCEDURE TODAY AT THE [facility]:   Refer to the procedure report that was given to you for any specific questions about what was found during the examination.  If the procedure report does not answer your questions, please call your gastroenterologist to clarify.  If you requested that your care partner not be given the details of your procedure findings, then the procedure report has been included in a sealed envelope for you to review at your convenience later.  YOU SHOULD EXPECT: Some feelings of bloating in the abdomen. Passage of more gas than usual.  Walking can help get rid of the air that was put into your GI tract during the procedure and reduce the bloating. If you had a lower endoscopy (such as a colonoscopy or flexible sigmoidoscopy) you may notice spotting of blood in your stool or on the toilet paper. If you underwent a bowel prep for your procedure, you may not have a normal bowel movement for a few days.  Please Note:  You might notice some irritation and congestion in your nose or some drainage.  This is from the oxygen used during your procedure.  There is no need for concern and it should clear up in a day or so.  SYMPTOMS TO REPORT IMMEDIATELY:   Following lower endoscopy (colonoscopy or flexible sigmoidoscopy):  Excessive amounts of blood in the stool  Significant tenderness or worsening of abdominal pains  Swelling of the abdomen that is new, acute  Fever of 100F or higher  For urgent or emergent issues, a gastroenterologist can be reached at any hour by calling [phone].   DIET:  We do recommend a small meal at first, but then you may proceed to your regular diet.  Drink plenty of fluids but you should avoid alcoholic beverages for 24 hours.  ACTIVITY:  You should plan to take it easy for the rest of today and you should NOT DRIVE or use heavy machinery until tomorrow (because of the sedation medicines used during the test).     FOLLOW UP: Our staff will call the number listed on your records the next business day following your procedure to check on you and address any questions or concerns that you may have regarding the information given to you following your procedure. If we do not reach you, we will leave a message.  However, if you are feeling well and you are not experiencing any problems, there is no need to return our call.  We will assume that you have returned to your regular daily activities without incident.  If any biopsies were taken you will be contacted by phone or by letter within the next 1-3 weeks.  Please call us at [phone] if you have not heard about the biopsies in 3 weeks.    SIGNATURES/CONFIDENTIALITY: You and/or your care partner have signed paperwork which will be entered into your electronic medical record.  These signatures attest to the fact that that the information above on your After Visit Summary has been reviewed and is understood.  Full responsibility of the confidentiality of this discharge information lies with you and/or your care-partner. 

## 2018-01-26 NOTE — Op Note (Signed)
[facility] Patient Name: Brooke Hanna Procedure Date: 01/26/2018 8:49 AM MRN: [id number] Endoscopist: Remo Lipps P. Armbruster MD, MD Age: 52 Referring MD:  Date of Birth: 1966-09-30 Gender: Female Account #: [id number] Procedure:                Colonoscopy Indications:              Screening for colorectal malignant neoplasm, This                            is the patient's first colonoscopy Medicines:                Monitored Anesthesia Care Procedure:                Pre-Anesthesia Assessment:                           - Prior to the procedure, a History and Physical                            was performed, and patient medications and                            allergies were reviewed. The patient's tolerance of                            previous anesthesia was also reviewed. The risks                            and benefits of the procedure and the sedation                            options and risks were discussed with the patient.                            All questions were answered, and informed consent                            was obtained. Prior Anticoagulants: The patient has                            taken no previous anticoagulant or antiplatelet                            agents. ASA Grade Assessment: II - A patient with                            mild systemic disease. After reviewing the risks                            and benefits, the patient was deemed in                            satisfactory condition to undergo the procedure.  After obtaining informed consent, the colonoscope                            was passed under direct vision. Throughout the                            procedure, the patient's blood pressure, pulse, and                            oxygen saturations were monitored continuously. The                            Colonoscope was introduced through the anus and                            advanced to the  the cecum, identified by                            appendiceal orifice and ileocecal valve. The                            colonoscopy was performed without difficulty. The                            patient tolerated the procedure well. The quality                            of the bowel preparation was good. The ileocecal                            valve, appendiceal orifice, and rectum were                            photographed. Scope In: 8:59:16 AM Scope Out: 9:16:53 AM Scope Withdrawal Time: 0 hours 13 minutes 54 seconds  Total Procedure Duration: 0 hours 17 minutes 37 seconds  Findings:                 The perianal and digital rectal examinations were                            normal.                           A 4 mm polyp was found in the hepatic flexure. The                            polyp was sessile. The polyp was removed with a                            cold snare. Resection and retrieval were complete.                           Multiple medium-mouthed diverticula were found in  the sigmoid colon, descending colon and ascending                            colon.                           The exam was otherwise without abnormality. Complications:            No immediate complications. Estimated blood loss:                            None. Estimated Blood Loss:     Estimated blood loss: none. Impression:               - One 4 mm polyp at the hepatic flexure, removed                            with a cold snare. Resected and retrieved.                           - Diverticulosis in the sigmoid colon, in the                            descending colon and in the ascending colon.                           - The examination was otherwise normal. Recommendation:           - Patient has a contact number available for                            emergencies. The signs and symptoms of potential                            delayed complications were discussed  with the                            patient. Return to normal activities tomorrow.                            Written discharge instructions were provided to the                            patient.                           - Resume previous diet.                           - Continue present medications.                           - Await pathology results.                           - Repeat colonoscopy for surveillance based on  pathology results. Remo Lipps P. Armbruster MD, MD 01/26/2018 9:20:12 AM This report has been signed electronically.

## 2018-01-26 NOTE — Progress Notes (Signed)
Pt's states no medical or surgical changes since previsit or office visit. 

## 2018-01-26 NOTE — Progress Notes (Signed)
To recovery, report to RN, VSS. 

## 2018-01-29 ENCOUNTER — Telehealth: Payer: Self-pay

## 2018-01-29 NOTE — Telephone Encounter (Signed)
  Follow up Call-  Call back number 01/26/2018  Post procedure Call Back phone  # [phone]  Permission to leave phone message Yes  Some recent data might be hidden     Patient questions:  Do you have a fever, pain , or abdominal swelling? No. Pain Score  0 *  Have you tolerated food without any problems? Yes.    Have you been able to return to your normal activities? Yes.    Do you have any questions about your discharge instructions: Diet   No. Medications  No. Follow up visit  No.  Do you have questions or concerns about your Care? No.  Actions: * If pain score is 4 or above: No action needed, pain <4.  No problems noted per pt. maw

## 2018-02-06 ENCOUNTER — Telehealth: Payer: Self-pay | Admitting: *Deleted

## 2018-02-06 ENCOUNTER — Encounter: Payer: Self-pay | Admitting: Gastroenterology

## 2018-02-06 NOTE — Telephone Encounter (Signed)
Pt called to office to discuss symptoms she is having.  Attempt to return call. No answer, LM on VM to call office.

## 2018-07-10 ENCOUNTER — Other Ambulatory Visit: Payer: Self-pay

## 2018-07-10 MED ORDER — METRONIDAZOLE 500 MG PO TABS: 500.0000 mg | ORAL_TABLET | Freq: Two times a day (BID) | ORAL | 0 refills | Status: DC

## 2018-07-10 MED ORDER — FLUCONAZOLE 150 MG PO TABS: 150.0000 mg | ORAL_TABLET | Freq: Once | ORAL | 0 refills | Status: AC

## 2018-07-10 NOTE — Progress Notes (Signed)
Pt c/o another BV infection. She is experiencing malodorous vaginal discharge. She said when she completes the ABx, she tends to develop a yeast infection. Flagyl sent to the pharmacy for BV per protocol and when completes the Flagyl, pt will take the Diflucan. Pt will contact the office if sx's do not subside.

## 2018-11-19 ENCOUNTER — Ambulatory Visit: Payer: BC Managed Care – PPO | Admitting: Obstetrics

## 2018-11-21 ENCOUNTER — Ambulatory Visit: Payer: BC Managed Care – PPO | Admitting: Obstetrics

## 2018-11-22 ENCOUNTER — Ambulatory Visit: Payer: BC Managed Care – PPO | Admitting: Obstetrics

## 2018-11-28 ENCOUNTER — Ambulatory Visit: Payer: BC Managed Care – PPO | Admitting: Obstetrics

## 2018-11-28 ENCOUNTER — Encounter: Payer: Self-pay | Admitting: Obstetrics

## 2018-11-28 VITALS — Ht 63.0 in | Wt 238.4 lb

## 2018-11-28 DIAGNOSIS — Z1151 Encounter for screening for human papillomavirus (HPV): Secondary | ICD-10-CM | POA: Diagnosis not present

## 2018-11-28 DIAGNOSIS — Z113 Encounter for screening for infections with a predominantly sexual mode of transmission: Secondary | ICD-10-CM

## 2018-11-28 DIAGNOSIS — Z124 Encounter for screening for malignant neoplasm of cervix: Secondary | ICD-10-CM

## 2018-11-28 DIAGNOSIS — N898 Other specified noninflammatory disorders of vagina: Secondary | ICD-10-CM

## 2018-11-28 DIAGNOSIS — Z01419 Encounter for gynecological examination (general) (routine) without abnormal findings: Secondary | ICD-10-CM | POA: Diagnosis not present

## 2018-11-28 DIAGNOSIS — N950 Postmenopausal bleeding: Secondary | ICD-10-CM

## 2018-11-28 NOTE — Patient Instructions (Signed)
Abnormal Uterine Bleeding Abnormal uterine bleeding is unusual bleeding from the uterus. It includes:  Bleeding or spotting between periods.  Bleeding after sex.  Bleeding that is heavier than normal.  Periods that last longer than usual.  Bleeding after menopause. Abnormal uterine bleeding can affect women at various stages in life, including teenagers, women in their reproductive years, pregnant women, and women who have reached menopause. Common causes of abnormal uterine bleeding include:  Pregnancy.  Growths of tissue (polyps).  A noncancerous tumor in the uterus (fibroid).  Infection.  Cancer.  Hormonal imbalances. Any type of abnormal bleeding should be evaluated by a health care provider. Many cases are minor and simple to treat, while others are more serious. Treatment will depend on the cause of the bleeding. Follow these instructions at home:  Monitor your condition for any changes.  Do not use tampons, douche, or have sex if told by your health care provider.  Change your pads often.  Get regular exams that include pelvic exams and cervical cancer screening.  Keep all follow-up visits as told by your health care provider. This is important. Contact a health care provider if:  Your bleeding lasts for more than one week.  You feel dizzy at times.  You feel nauseous or you vomit. Get help right away if:  You pass out.  Your bleeding soaks through a pad every hour.  You have abdominal pain.  You have a fever.  You become sweaty or weak.  You pass large blood clots from your vagina. Summary  Abnormal uterine bleeding is unusual bleeding from the uterus.  Any type of abnormal bleeding should be evaluated by a health care provider. Many cases are minor and simple to treat, while others are more serious.  Treatment will depend on the cause of the bleeding. This information is not intended to replace advice given to you by your health care provider.  Make sure you discuss any questions you have with your health care provider. Document Released: 10/03/2005 Document Revised: 11/04/2016 Document Reviewed: 11/04/2016 Elsevier Interactive Patient Education  2019 [employer] Inc[employer]  Postmenopausal Bleeding  Postmenopausal bleeding is any bleeding that happens after menopause. Menopause is when a woman's period stops. Any type of bleeding after menopause should be checked by your doctor. Treatment will depend on the cause. Follow these instructions at home:  Pay attention to any changes in your symptoms.  Avoid using tampons and douches as told by your doctor.  Change your pads regularly.  Get regular pelvic exams and Pap tests.  Take iron pills as told by your doctor.  Take over-the-counter and prescription medicines only as told by your doctor.  Keep all follow-up visits as told by your doctor. This is important. Contact a doctor if:  Your bleeding lasts for more than 1 week.  You have belly (abdominal) pain.  You have bleeding during or after sex (intercourse).  You have bleeding that happens more often than every 3 weeks. Get help right away if:  You have a fever, chills, a headache, dizziness, muscle aches, and bleeding.  You have strong pain with bleeding.  You have clumps of blood (blood clots) coming from your vagina.  You have a lot of bleeding and: ? You need more than 1 pad an hour. ? This has never happened before.  You feel like you are going to pass out (faint). Summary  Any type of bleeding after menopause should be checked by your doctor.  Pay attention to any changes in  your symptoms.  Keep all follow-up visits as told by your doctor. This information is not intended to replace advice given to you by your health care provider. Make sure you discuss any questions you have with your health care provider. Document Released: 07/12/2008 Document Revised: 11/08/2016 Document Reviewed: 11/08/2016 Elsevier  Interactive Patient Education  2019 [employer].  Endometrial Biopsy  Endometrial biopsy is a procedure in which a tissue sample is taken from inside the uterus. The sample is taken from the endometrium, which is the lining of the uterus. The tissue sample is then checked under a microscope to see if the tissue is normal or abnormal. This procedure helps to determine where you are in your menstrual cycle and how hormone levels are affecting the lining of the uterus. This procedure may also be used to evaluate uterine bleeding or to diagnose endometrial cancer, endometrial tuberculosis, polyps, or other inflammatory conditions. Tell a health care provider about:  Any allergies you have.  All medicines you are taking, including vitamins, herbs, eye drops, creams, and over-the-counter medicines.  Any problems you or family members have had with anesthetic medicines.  Any blood disorders you have.  Any surgeries you have had.  Any medical conditions you have.  Whether you are pregnant or may be pregnant. What are the risks? Generally, this is a safe procedure. However, problems may occur, including:  Bleeding.  Pelvic infection.  Puncture of the wall of the uterus with the biopsy device (rare). What happens before the procedure?  Keep a record of your menstrual cycles as told by your health care provider. You may need to schedule your procedure for a specific time in your cycle.  You may want to bring a sanitary pad to wear after the procedure.  Ask your health care provider about: ? Changing or stopping your regular medicines. This is especially important if you are taking diabetes medicines or blood thinners. ? Taking medicines such as aspirin and ibuprofen. These medicines can thin your blood. Do not take these medicines before your procedure if your health care provider instructs you not to.  Plan to have someone take you home from the hospital or clinic. What happens during  the procedure?  To lower your risk of infection: ? Your health care team will wash or sanitize their hands.  You will lie on an exam table with your feet and legs supported as in a pelvic exam.  Your health care provider will insert an instrument (speculum) into your vagina to see your cervix.  Your cervix will be cleansed with an antiseptic solution.  A medicine (local anesthetic) will be used to numb the cervix.  A forceps instrument (tenaculum) will be used to hold your cervix steady for the biopsy.  A thin, rod-like instrument (uterine sound) will be inserted through your cervix to determine the length of your uterus and the location where the biopsy sample will be removed.  A thin, flexible tube (catheter) will be inserted through your cervix and into the uterus. The catheter will be used to collect the biopsy sample from your endometrial tissue.  The catheter and speculum will then be removed, and the tissue sample will be sent to a lab for examination. What happens after the procedure?  You will rest in a recovery area until you are ready to go home.  You may have mild cramping and a small amount of vaginal bleeding. This is normal.  It is up to you to get the results of your  procedure. Ask your health care provider, or the department that is doing the procedure, when your results will be ready. Summary  Endometrial biopsy is a procedure in which a tissue sample is taken from the endometrium, which is the lining of the uterus.  This procedure may help to diagnose menstrual cycle problems, abnormal bleeding, or other conditions affecting the endometrium.  Before the procedure, keep a record of your menstrual cycles as told by your health care provider.  The tissue sample that is removed will be checked under a microscope to see if it is normal or abnormal. This information is not intended to replace advice given to you by your health care provider. Make sure you discuss any  questions you have with your health care provider. Document Released: 02/03/2005 Document Revised: 10/19/2016 Document Reviewed: 10/19/2016 Elsevier Interactive Patient Education  2019 [place].  Endometrial Biopsy, Care After This sheet gives you information about how to care for yourself after your procedure. Your health care provider may also give you more specific instructions. If you have problems or questions, contact your health care provider. What can I expect after the procedure? After the procedure, it is common to have:  Mild cramping.  A small amount of vaginal bleeding for a few days. This is normal. Follow these instructions at home:   Take over-the-counter and prescription medicines only as told by your health care provider.  Do not douche, use tampons, or have sexual intercourse until your health care provider approves.  Return to your normal activities as told by your health care provider. Ask your health care provider what activities are safe for you.  Follow instructions from your health care provider about any activity restrictions, such as restrictions on strenuous exercise or heavy lifting. Contact a health care provider if:  You have heavy bleeding, or bleed for longer than 2 days after the procedure.  You have bad smelling discharge from your vagina.  You have a fever or chills.  You have a burning sensation when urinating or you have difficulty urinating.  You have severe pain in your lower abdomen. Get help right away if:  You have severe cramps in your stomach or back.  You pass large blood clots.  Your bleeding increases.  You become weak or light-headed, or you pass out. Summary  After the procedure, it is common to have mild cramping and a small amount of vaginal bleeding for a few days.  Do not douche, use tampons, or have sexual intercourse until your health care provider approves.  Return to your normal activities as told by your  health care provider. Ask your health care provider what activities are safe for you. This information is not intended to replace advice given to you by your health care provider. Make sure you discuss any questions you have with your health care provider. Document Released: 07/24/2013 Document Revised: 10/19/2016 Document Reviewed: 10/19/2016 Elsevier Interactive Patient Education  2019 [employer].

## 2018-11-28 NOTE — Progress Notes (Signed)
Pt presents for annual. Pt c/o PMB x couple months.  No periods x 10+ years previously   Mammogram due - Normal Mammogram 11/27/17 Colonoscopy - UTD

## 2018-11-29 ENCOUNTER — Encounter: Payer: Self-pay | Admitting: Obstetrics

## 2018-11-29 LAB — CERVICOVAGINAL ANCILLARY ONLY
Bacterial vaginitis: NEGATIVE
CANDIDA VAGINITIS: NEGATIVE
CHLAMYDIA, DNA PROBE: NEGATIVE
NEISSERIA GONORRHEA: NEGATIVE
Trichomonas: NEGATIVE

## 2018-11-29 NOTE — Progress Notes (Addendum)
Subjective:        Brooke Hanna is a 53 y.o. female here for a routine exam.  Current complaints: Vaginal bleeding.  No period for 10 years prior to onset of a period-like bleeding for one day, then spotting off and on.  Denies cramping.  Personal health questionnaire:  Is patient Ashkenazi Jewish, have a family history of breast and/or ovarian cancer: no Is there a family history of uterine cancer diagnosed at age < 53, gastrointestinal cancer, urinary tract cancer, family member who is a Field seismologist syndrome-associated carrier: no Is the patient overweight and hypertensive, family history of diabetes, personal history of gestational diabetes, preeclampsia or PCOS: no Is patient over 39, have PCOS,  family history of premature CHD under age 53, diabetes, smoke, have hypertension or peripheral artery disease:  no At any time, has a partner hit, kicked or otherwise hurt or frightened you?: no Over the past 2 weeks, have you felt down, depressed or hopeless?: no Over the past 2 weeks, have you felt little interest or pleasure in doing things?:no   Gynecologic History No LMP recorded. Patient is postmenopausal. Contraception: post menopausal status Last Pap: 2019. Results were: normal Last mammogram: 2019. Results were: normal  Obstetric History OB History  Gravida Para Term Preterm AB Living  '3       1 2  '$ SAB TAB Ectopic Multiple Live Births  1       2    # Outcome Date GA Lbr Len/2nd Weight Sex Delivery Anes PTL Lv  3 Gravida 11/22/89    M Vag-Spont   LIV  2 Gravida 09/17/81    M Vag-Spont   LIV  1 SAB             Past Medical History:  Diagnosis Date  . Hypertension     Past Surgical History:  Procedure Laterality Date  . TUBAL LIGATION       Current Outpatient Medications:  .  fluconazole (DIFLUCAN) 150 MG tablet, Take 1 tablet (150 mg total) by mouth once., Disp: 1 tablet, Rfl: 4 .  ibuprofen (ADVIL,MOTRIN) 800 MG tablet, Take 1 tablet (800 mg total) by mouth 3  (three) times daily., Disp: 21 tablet, Rfl: 0 .  losartan-hydrochlorothiazide (HYZAAR) 100-25 MG tablet, Take 1 tablet by mouth daily., Disp: , Rfl:  .  metroNIDAZOLE (FLAGYL) 500 MG tablet, Take 1 tablet (500 mg total) by mouth 2 (two) times daily., Disp: 14 tablet, Rfl: 4 .  metroNIDAZOLE (FLAGYL) 500 MG tablet, Take 1 tablet (500 mg total) by mouth 2 (two) times daily., Disp: 14 tablet, Rfl: 0  Current Facility-Administered Medications:  .  0.9 %  sodium chloride infusion, 500 mL, Intravenous, Once, Armbruster, Carlota Raspberry, MD Allergies  Allergen Reactions  . Sulfa Antibiotics Swelling    Social History   Tobacco Use  . Smoking status: Never Smoker  . Smokeless tobacco: Never Used  Substance Use Topics  . Alcohol use: Yes    Alcohol/week: 0.0 standard drinks    Comment: social    Family History  Problem Relation Age of Onset  . Breast cancer Maternal Aunt   . BRCA 1/2 Neg Hx   . Colon cancer Neg Hx   . Esophageal cancer Neg Hx   . Liver cancer Neg Hx   . Pancreatic cancer Neg Hx   . Rectal cancer Neg Hx   . Stomach cancer Neg Hx       Review of Systems  Constitutional: negative for fatigue and weight  loss Respiratory: negative for cough and wheezing Cardiovascular: negative for chest pain, fatigue and palpitations Gastrointestinal: negative for abdominal pain and change in bowel habits Musculoskeletal:negative for myalgias Neurological: negative for gait problems and tremors Behavioral/Psych: negative for abusive relationship, depression Endocrine: negative for temperature intolerance    Genitourinary:POSITIVE for postmenopausal bleeding Integument/breast: negative for breast lump, breast tenderness, nipple discharge and skin lesion(s)    Objective:       Ht '5\' 3"'$  (1.6 m)   Wt 238 lb 6.4 oz (108.1 kg)   BMI 42.23 kg/m  General:   alert  Skin:   no rash or abnormalities  Lungs:   clear to auscultation bilaterally  Heart:   regular rate and rhythm, S1, S2  normal, no murmur, click, rub or gallop  Breasts:   normal without suspicious masses, skin or nipple changes or axillary nodes  Abdomen:  normal findings: no organomegaly, soft, non-tender and no hernia  Pelvis:  External genitalia: normal general appearance Urinary system: urethral meatus normal and bladder without fullness, nontender Vaginal: normal without tenderness, induration or masses Cervix: stenosed, no lesions, discharge Adnexa: normal bimanual exam Uterus: anteverted and non-tender, normal size   Lab Review Urine pregnancy test Labs reviewed yes Radiologic studies reviewed yes  50% of 20 min visit spent on counseling and coordination of care.   Assessment:     1. Encounter for routine gynecological examination with Papanicolaou smear of cervix Rx: - Cytology - PAP  2. Postmenopausal bleeding Rx: - US PELVIC COMPLETE WITH TRANSVAGINAL; Future  3. Vaginal discharge Rx: - Cervicovaginal ancillary only( [facility])    Plan:    Education reviewed: calcium supplements, depression evaluation, low fat, low cholesterol diet, safe sex/STD prevention, self breast exams and weight bearing exercise. Follow up in: 2 weeks.   No orders of the defined types were placed in this encounter.  Orders Placed This Encounter  Procedures  . US PELVIC COMPLETE WITH TRANSVAGINAL    Standing Status:   Future    Standing Expiration Date:   01/27/2020    Order Specific Question:   Reason for Exam (SYMPTOM  OR DIAGNOSIS REQUIRED)    Answer:   Postmenopausal bleeding    Order Specific Question:   Preferred imaging location?    Answer:   [facility] Simona Huh MD 11-28-2018

## 2018-12-03 LAB — CYTOLOGY - PAP
Diagnosis: NEGATIVE
HPV: NOT DETECTED

## 2018-12-06 ENCOUNTER — Ambulatory Visit (HOSPITAL_COMMUNITY)
Admission: RE | Admit: 2018-12-06 | Discharge: 2018-12-06 | Disposition: A | Discharge status: Home / Self Care | Payer: BC Managed Care – PPO | Source: Ambulatory Visit | Attending: Obstetrics | Admitting: Obstetrics

## 2018-12-06 DIAGNOSIS — N950 Postmenopausal bleeding: Secondary | ICD-10-CM | POA: Diagnosis not present

## 2018-12-12 ENCOUNTER — Ambulatory Visit: Payer: BC Managed Care – PPO | Admitting: Obstetrics

## 2018-12-18 ENCOUNTER — Ambulatory Visit: Payer: BC Managed Care – PPO | Admitting: Obstetrics

## 2018-12-18 ENCOUNTER — Encounter: Payer: Self-pay | Admitting: Obstetrics

## 2018-12-18 VITALS — BP 128/87 | HR 84 | Ht 63.0 in | Wt 239.6 lb

## 2018-12-18 DIAGNOSIS — N950 Postmenopausal bleeding: Secondary | ICD-10-CM | POA: Diagnosis not present

## 2018-12-18 DIAGNOSIS — R9389 Abnormal findings on diagnostic imaging of other specified body structures: Secondary | ICD-10-CM

## 2018-12-18 DIAGNOSIS — D252 Subserosal leiomyoma of uterus: Secondary | ICD-10-CM

## 2018-12-18 NOTE — Progress Notes (Addendum)
Patient ID: Brooke Hanna, female   DOB: [birth year], 53 y.o.   MRN: [id number]  Chief Complaint  Patient presents with  . Follow-up    HPI Brooke Hanna is a 53 y.o. female.  History of postmenopausal bleeding.  Ultrasound done.  Patient presents today for discussion of ultrasound results and management recommendations. HPI  Past Medical History:  Diagnosis Date  . Hypertension     Past Surgical History:  Procedure Laterality Date  . TUBAL LIGATION      Family History  Problem Relation Age of Onset  . Breast cancer Maternal Aunt   . BRCA 1/2 Neg Hx   . Colon cancer Neg Hx   . Esophageal cancer Neg Hx   . Liver cancer Neg Hx   . Pancreatic cancer Neg Hx   . Rectal cancer Neg Hx   . Stomach cancer Neg Hx     Social History Social History   Tobacco Use  . Smoking status: Never Smoker  . Smokeless tobacco: Never Used  Substance Use Topics  . Alcohol use: Yes    Alcohol/week: 0.0 standard drinks    Comment: social  . Drug use: No    Allergies  Allergen Reactions  . Sulfa Antibiotics Swelling    Current Outpatient Medications  Medication Sig Dispense Refill  . fluconazole (DIFLUCAN) 150 MG tablet Take 1 tablet (150 mg total) by mouth once. 1 tablet 4  . ibuprofen (ADVIL,MOTRIN) 800 MG tablet Take 1 tablet (800 mg total) by mouth 3 (three) times daily. 21 tablet 0  . losartan-hydrochlorothiazide (HYZAAR) 100-25 MG tablet Take 1 tablet by mouth daily.    . metroNIDAZOLE (FLAGYL) 500 MG tablet Take 1 tablet (500 mg total) by mouth 2 (two) times daily. 14 tablet 4  . metroNIDAZOLE (FLAGYL) 500 MG tablet Take 1 tablet (500 mg total) by mouth 2 (two) times daily. 14 tablet 0   Current Facility-Administered Medications  Medication Dose Route Frequency Provider Last Rate Last Dose  . 0.9 %  sodium chloride infusion  500 mL Intravenous Once Armbruster, Carlota Raspberry, MD        Review of Systems Review of Systems Constitutional: negative for fatigue and weight  loss Respiratory: negative for cough and wheezing Cardiovascular: negative for chest pain, fatigue and palpitations Gastrointestinal: negative for abdominal pain and change in bowel habits Genitourinary:positive for postmenopausal bleeding Integument/breast: negative for nipple discharge Musculoskeletal:negative for myalgias Neurological: negative for gait problems and tremors Behavioral/Psych: negative for abusive relationship, depression Endocrine: negative for temperature intolerance      Blood pressure 128/87, pulse 84, height '5\' 3"'$  (1.6 m), weight 239 lb 9.6 oz (108.7 kg).  Physical Exam Physical Exam:  Deferred  >50 of 15 min visit spent on counseling and coordination of care.   Data Reviewed Ultrasound: US PELVIC COMPLETE WITH TRANSVAGINAL (Accession [id number]) (Order [id number])  Imaging  Date: 12/06/2018 Department: [facility] Released By: Adelene Amas, NT Authorizing: Shelly Bombard, MD  Exam Information   Status Exam Begun  Exam Ended   Final [99] 12/06/2018 8:04 AM 12/06/2018 8:29 AM  PACS Images   Show images for US PELVIC COMPLETE WITH TRANSVAGINAL  Study Result   CLINICAL DATA:  Postmenopausal bleeding  EXAM: TRANSABDOMINAL AND TRANSVAGINAL ULTRASOUND OF PELVIS  TECHNIQUE: Both transabdominal and transvaginal ultrasound examinations of the pelvis were performed. Transabdominal technique was performed for global imaging of the pelvis including uterus, ovaries, adnexal regions, and pelvic cul-de-sac. It was necessary to proceed with endovaginal exam following the  transabdominal exam to visualize the M uterus, endometrium, and ovaries.  COMPARISON:  None  FINDINGS: Uterus  Measurements: 9.7 x 4.9 x 5.3 cm = volume: 132 mL. Normal echogenicity. Small anterior wall subserosal leiomyoma 2.2 x 1.8 x 1.8 cm. No definite additional mass lesions.  Endometrium  Thickness: 11 mm, abnormal. Mildly heterogeneous  without definite focal mass or endometrial fluid  Right ovary  Not visualized on either transabdominal or endovaginal imaging, likely obscured by bowel  Left ovary  Measurements: 2.2 x 1.3 x 1.7 cm = volume: 2.4 mL. Normal morphology without mass  Other findings  No free pelvic fluid.  No adnexal masses.  IMPRESSION: Small anterior wall subserosal uterine leiomyoma 2.2 cm greatest size.  Abnormal thickening of the endometrial complex 11 mm thick in a postmenopausal patient.  In the setting of post-menopausal bleeding, endometrial sampling is indicated to exclude carcinoma. If results are benign, sonohysterogram should be considered for focal lesion work-up. (Ref: Radiological Reasoning: Algorithmic Workup of Abnormal Vaginal Bleeding with Endovaginal Sonography and Sonohysterography. AJR 2008; 272:Z36-64).   Electronically Signed   By: Lavonia Dana M.D.   On: 12/06/2018 10:01     Assessment     1. Postmenopausal bleeding  2. Endometrial thickening on ultrasound - Endometrial Biopsy scheduled  3. Fibroids, subserous - stable clinically    Plan    Follow up in 2 weeks for Endometrial Biopsy   No orders of the defined types were placed in this encounter.  No orders of the defined types were placed in this encounter.   Shelly Bombard MD 12-18-2018

## 2018-12-18 NOTE — Patient Instructions (Signed)
Abnormal Uterine Bleeding Abnormal uterine bleeding means bleeding more than usual from your uterus. It can include:  Bleeding between periods.  Bleeding after sex.  Bleeding that is heavier than normal.  Periods that last longer than usual.  Bleeding after you have stopped having your period (menopause). There are many problems that may cause this. You should see a doctor for any kind of bleeding that is not normal. Treatment depends on the cause of the bleeding. Follow these instructions at home:  Watch your condition for any changes.  Do not use tampons, douche, or have sex, if your doctor tells you not to.  Change your pads often.  Get regular well-woman exams. Make sure they include a pelvic exam and cervical cancer screening.  Keep all follow-up visits as told by your doctor. This is important. Contact a doctor if:  The bleeding lasts more than one week.  You feel dizzy at times.  You feel like you are going to throw up (nauseous).  You throw up. Get help right away if:  You pass out.  You have to change pads every hour.  You have belly (abdominal) pain.  You have a fever.  You get sweaty.  You get weak.  You passing large blood clots from your vagina. Summary  Abnormal uterine bleeding means bleeding more than usual from your uterus.  There are many problems that may cause this. You should see a doctor for any kind of bleeding that is not normal.  Treatment depends on the cause of the bleeding. This information is not intended to replace advice given to you by your health care provider. Make sure you discuss any questions you have with your health care provider. Document Released: 07/31/2009 Document Revised: 09/27/2016 Document Reviewed: 09/27/2016 Elsevier Interactive Patient Education  2019 [place].  Endometrial Biopsy  Endometrial biopsy is a procedure in which a tissue sample is taken from inside the uterus. The sample is taken from the  endometrium, which is the lining of the uterus. The tissue sample is then checked under a microscope to see if the tissue is normal or abnormal. This procedure helps to determine where you are in your menstrual cycle and how hormone levels are affecting the lining of the uterus. This procedure may also be used to evaluate uterine bleeding or to diagnose endometrial cancer, endometrial tuberculosis, polyps, or other inflammatory conditions. Tell a health care provider about:  Any allergies you have.  All medicines you are taking, including vitamins, herbs, eye drops, creams, and over-the-counter medicines.  Any problems you or family members have had with anesthetic medicines.  Any blood disorders you have.  Any surgeries you have had.  Any medical conditions you have.  Whether you are pregnant or may be pregnant. What are the risks? Generally, this is a safe procedure. However, problems may occur, including:  Bleeding.  Pelvic infection.  Puncture of the wall of the uterus with the biopsy device (rare). What happens before the procedure?  Keep a record of your menstrual cycles as told by your health care provider. You may need to schedule your procedure for a specific time in your cycle.  You may want to bring a sanitary pad to wear after the procedure.  Ask your health care provider about: ? Changing or stopping your regular medicines. This is especially important if you are taking diabetes medicines or blood thinners. ? Taking medicines such as aspirin and ibuprofen. These medicines can thin your blood. Do not take these medicines  before your procedure if your health care provider instructs you not to.  Plan to have someone take you home from the hospital or clinic. What happens during the procedure?  To lower your risk of infection: ? Your health care team will wash or sanitize their hands.  You will lie on an exam table with your feet and legs supported as in a pelvic  exam.  Your health care provider will insert an instrument (speculum) into your vagina to see your cervix.  Your cervix will be cleansed with an antiseptic solution.  A medicine (local anesthetic) will be used to numb the cervix.  A forceps instrument (tenaculum) will be used to hold your cervix steady for the biopsy.  A thin, rod-like instrument (uterine sound) will be inserted through your cervix to determine the length of your uterus and the location where the biopsy sample will be removed.  A thin, flexible tube (catheter) will be inserted through your cervix and into the uterus. The catheter will be used to collect the biopsy sample from your endometrial tissue.  The catheter and speculum will then be removed, and the tissue sample will be sent to a lab for examination. What happens after the procedure?  You will rest in a recovery area until you are ready to go home.  You may have mild cramping and a small amount of vaginal bleeding. This is normal.  It is up to you to get the results of your procedure. Ask your health care provider, or the department that is doing the procedure, when your results will be ready. Summary  Endometrial biopsy is a procedure in which a tissue sample is taken from the endometrium, which is the lining of the uterus.  This procedure may help to diagnose menstrual cycle problems, abnormal bleeding, or other conditions affecting the endometrium.  Before the procedure, keep a record of your menstrual cycles as told by your health care provider.  The tissue sample that is removed will be checked under a microscope to see if it is normal or abnormal. This information is not intended to replace advice given to you by your health care provider. Make sure you discuss any questions you have with your health care provider. Document Released: 02/03/2005 Document Revised: 10/19/2016 Document Reviewed: 10/19/2016 Elsevier Interactive Patient Education  2019  [employer]  Postmenopausal Bleeding  Postmenopausal bleeding is any bleeding that happens after menopause. Menopause is when a woman's period stops. Any type of bleeding after menopause should be checked by your doctor. Treatment will depend on the cause. Follow these instructions at home:  Pay attention to any changes in your symptoms.  Avoid using tampons and douches as told by your doctor.  Change your pads regularly.  Get regular pelvic exams and Pap tests.  Take iron pills as told by your doctor.  Take over-the-counter and prescription medicines only as told by your doctor.  Keep all follow-up visits as told by your doctor. This is important. Contact a doctor if:  Your bleeding lasts for more than 1 week.  You have belly (abdominal) pain.  You have bleeding during or after sex (intercourse).  You have bleeding that happens more often than every 3 weeks. Get help right away if:  You have a fever, chills, a headache, dizziness, muscle aches, and bleeding.  You have strong pain with bleeding.  You have clumps of blood (blood clots) coming from your vagina.  You have a lot of bleeding and: ? You need more than  1 pad an hour. ? This has never happened before.  You feel like you are going to pass out (faint). Summary  Any type of bleeding after menopause should be checked by your doctor.  Pay attention to any changes in your symptoms.  Keep all follow-up visits as told by your doctor. This information is not intended to replace advice given to you by your health care provider. Make sure you discuss any questions you have with your health care provider. Document Released: 07/12/2008 Document Revised: 11/08/2016 Document Reviewed: 11/08/2016 Elsevier Interactive Patient Education  2019 [employer].

## 2018-12-18 NOTE — Progress Notes (Signed)
Presents for FU Ultrasound Results.

## 2019-01-01 ENCOUNTER — Encounter: Payer: Self-pay | Admitting: Obstetrics

## 2019-01-01 ENCOUNTER — Ambulatory Visit: Payer: BC Managed Care – PPO | Admitting: Obstetrics

## 2019-01-01 ENCOUNTER — Other Ambulatory Visit: Payer: Self-pay

## 2019-01-01 VITALS — BP 131/84 | HR 73 | Wt 237.0 lb

## 2019-01-01 DIAGNOSIS — N882 Stricture and stenosis of cervix uteri: Secondary | ICD-10-CM | POA: Diagnosis not present

## 2019-01-01 DIAGNOSIS — N950 Postmenopausal bleeding: Secondary | ICD-10-CM | POA: Diagnosis not present

## 2019-01-01 DIAGNOSIS — R9389 Abnormal findings on diagnostic imaging of other specified body structures: Secondary | ICD-10-CM

## 2019-01-01 DIAGNOSIS — Z3202 Encounter for pregnancy test, result negative: Secondary | ICD-10-CM

## 2019-01-01 DIAGNOSIS — Z01812 Encounter for preprocedural laboratory examination: Secondary | ICD-10-CM

## 2019-01-01 LAB — POCT URINE PREGNANCY: Preg Test, Ur: NEGATIVE

## 2019-01-01 MED ORDER — MISOPROSTOL 200 MCG PO TABS: 400.0000 ug | ORAL_TABLET | Freq: Once | ORAL | 0 refills | Status: DC

## 2019-01-01 NOTE — Progress Notes (Signed)
Endometrial Biopsy Procedure Note  Pre-operative Diagnosis: Postmenopausal Bleeding.  Thickened Endometrium on Ultrasound  Post-operative Diagnosis: same  Indications: Postmenopausal Bleeding  Procedure Details   Urine pregnancy test was done in office and result was negative.  The risks (including infection, bleeding, pain, and uterine perforation) and benefits of the procedure were explained to the patient and Written informed consent was obtained.    The patient was placed in the dorsal lithotomy position.  Bimanual exam showed the uterus to be in the neutral position.  A Graves' speculum inserted in the vagina, and the cervix was noted to be stenosed.  Dilation of the os was attempted with a graduated uterine sound / dilator without success.  The procedure was then terminated.   Condition: Stable  Complications: None  Plan:  The problem of cervical stenosis was explained and patient was instructed to return in 2 weeks for Endometrial Biopsy after cervical ripening with Cytotec 3-4 hours prior to procedure.  All questions were answered to her satisfaction and she agreed to the plan.  Attending Physician Documentation: I was present for or participated in the entire procedure, including opening and closing.    Shelly Bombard MD 01-01-2019

## 2019-01-15 ENCOUNTER — Ambulatory Visit (INDEPENDENT_AMBULATORY_CARE_PROVIDER_SITE_OTHER): Payer: BC Managed Care – PPO | Admitting: Obstetrics

## 2019-01-15 ENCOUNTER — Other Ambulatory Visit: Payer: Self-pay

## 2019-01-15 ENCOUNTER — Other Ambulatory Visit (HOSPITAL_COMMUNITY)
Admission: RE | Admit: 2019-01-15 | Discharge: 2019-01-15 | Disposition: A | Discharge status: Home / Self Care | Payer: BC Managed Care – PPO | Source: Ambulatory Visit | Attending: Obstetrics | Admitting: Obstetrics

## 2019-01-15 ENCOUNTER — Encounter: Payer: Self-pay | Admitting: Obstetrics

## 2019-01-15 VITALS — BP 156/108 | HR 71 | Wt 240.6 lb

## 2019-01-15 DIAGNOSIS — N950 Postmenopausal bleeding: Secondary | ICD-10-CM | POA: Diagnosis not present

## 2019-01-15 DIAGNOSIS — N840 Polyp of corpus uteri: Secondary | ICD-10-CM | POA: Diagnosis not present

## 2019-01-15 DIAGNOSIS — N882 Stricture and stenosis of cervix uteri: Secondary | ICD-10-CM

## 2019-01-15 DIAGNOSIS — R9389 Abnormal findings on diagnostic imaging of other specified body structures: Secondary | ICD-10-CM

## 2019-01-15 DIAGNOSIS — Z3202 Encounter for pregnancy test, result negative: Secondary | ICD-10-CM

## 2019-01-15 LAB — POCT URINE PREGNANCY: PREG TEST UR: NEGATIVE

## 2019-01-15 NOTE — Progress Notes (Signed)
negPt presents for EBx dx: PMB. Pt took Cytotec prior to appt.  Elevated BP - pt did not take her meds today

## 2019-01-15 NOTE — Progress Notes (Addendum)
Endometrial Biopsy Procedure Note  Pre-operative Diagnosis: Postmenopausal Bleeding.                                              Endometrial Thickening on Ultrasound                                              Cervical Stenosis  Post-operative Diagnosis: same  Indications: Postmenopausal Bleeding.  Endometrial Thickening on Ultrasound  Procedure Details   Urine pregnancy test was done in office and result was negative.  The risks (including infection, bleeding, pain, and uterine perforation) and benefits of the procedure were explained to the patient and Written informed consent was obtained.    The patient was premedicated with Cytotec 400 micrograms vaginally 3 -4 hours prior to the procedure for cervical stenosis.  The patient was placed in the dorsal lithotomy position.  Bimanual exam showed the uterus to be in the neutral position.  A Graves' speculum inserted in the vagina, and the cervix prepped with povidone iodine.  Endocervical curettage with a Kevorkian curette was not performed.   A sharp tenaculum was applied to the anterior lip of the cervix for stabilization.  The cervical os was dilated adequately with a small Hegar type Dilator.  A sterile uterine sound was used to sound the uterus to a depth of 8cm.  A Pipelle endometrial aspirator was used to sample the endometrium.  Sample was sent for pathologic examination.  Condition: Stable  Complications: None  Plan:  The patient was advised to call for any fever or for prolonged or severe pain or bleeding. She was advised to use NSAID as needed for mild to moderate pain. She was advised to avoid vaginal intercourse for 48 hours or until the bleeding has completely stopped.  Attending Physician Documentation: I was present for or participated in the entire procedure, including opening and closing.   Shelly Bombard MD 01-15-2019

## 2019-01-17 ENCOUNTER — Other Ambulatory Visit: Payer: Self-pay | Admitting: Obstetrics

## 2019-01-17 DIAGNOSIS — N950 Postmenopausal bleeding: Secondary | ICD-10-CM

## 2019-01-17 DIAGNOSIS — R9389 Abnormal findings on diagnostic imaging of other specified body structures: Principal | ICD-10-CM

## 2019-01-17 MED ORDER — MEDROXYPROGESTERONE ACETATE 10 MG PO TABS: 20.0000 mg | ORAL_TABLET | Freq: Every day | ORAL | 0 refills | Status: DC

## 2019-02-05 ENCOUNTER — Telehealth: Payer: Self-pay | Admitting: *Deleted

## 2019-02-05 NOTE — Telephone Encounter (Signed)
Pt called to office stating she is still having some irregular spotting after recent Endo Bx. Pt hgas been taking Rx that was given (Provera?) and is unsure if this may be causing irregularity.  Pt ask for further recommendations.    Please advise.

## 2019-02-08 ENCOUNTER — Other Ambulatory Visit: Payer: Self-pay | Admitting: Obstetrics

## 2019-02-08 DIAGNOSIS — N946 Dysmenorrhea, unspecified: Secondary | ICD-10-CM

## 2019-02-08 MED ORDER — IBUPROFEN 800 MG PO TABS: 800.0000 mg | ORAL_TABLET | Freq: Three times a day (TID) | ORAL | 5 refills | Status: DC

## 2020-02-17 ENCOUNTER — Other Ambulatory Visit: Payer: Self-pay | Admitting: Family Medicine

## 2020-02-17 DIAGNOSIS — Z1231 Encounter for screening mammogram for malignant neoplasm of breast: Secondary | ICD-10-CM

## 2020-04-21 ENCOUNTER — Telehealth: Payer: Self-pay

## 2020-04-21 MED ORDER — FLUCONAZOLE 150 MG PO TABS: 150.0000 mg | ORAL_TABLET | Freq: Once | ORAL | 0 refills | Status: AC

## 2020-04-21 MED ORDER — METRONIDAZOLE 500 MG PO TABS: 500.0000 mg | ORAL_TABLET | Freq: Two times a day (BID) | ORAL | 0 refills | Status: DC

## 2020-04-21 NOTE — Telephone Encounter (Signed)
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 Sent per protocol. Transferred to be scheduled for annual.

## 2020-05-06 ENCOUNTER — Ambulatory Visit: Payer: BC Managed Care – PPO | Admitting: Obstetrics

## 2021-01-04 ENCOUNTER — Other Ambulatory Visit (HOSPITAL_COMMUNITY)
Admission: RE | Admit: 2021-01-04 | Discharge: 2021-01-04 | Disposition: A | Discharge status: Home / Self Care | Payer: Self-pay | Source: Ambulatory Visit | Attending: Family Medicine | Admitting: Family Medicine

## 2021-01-04 ENCOUNTER — Other Ambulatory Visit: Payer: Self-pay | Admitting: Family Medicine

## 2021-01-04 DIAGNOSIS — Z124 Encounter for screening for malignant neoplasm of cervix: Secondary | ICD-10-CM | POA: Insufficient documentation

## 2021-01-13 LAB — CYTOLOGY - PAP
Comment: NEGATIVE
Diagnosis: NEGATIVE
High risk HPV: NEGATIVE

## 2021-01-27 ENCOUNTER — Other Ambulatory Visit: Payer: Self-pay | Admitting: Family Medicine

## 2021-01-27 DIAGNOSIS — Z1231 Encounter for screening mammogram for malignant neoplasm of breast: Secondary | ICD-10-CM

## 2021-01-28 ENCOUNTER — Other Ambulatory Visit: Payer: Self-pay | Admitting: Family Medicine

## 2021-01-28 DIAGNOSIS — N950 Postmenopausal bleeding: Secondary | ICD-10-CM

## 2021-02-12 ENCOUNTER — Ambulatory Visit
Admission: RE | Admit: 2021-02-12 | Discharge: 2021-02-12 | Disposition: A | Discharge status: Home / Self Care | Payer: BC Managed Care – PPO | Source: Ambulatory Visit | Attending: Family Medicine | Admitting: Family Medicine

## 2021-02-12 DIAGNOSIS — N950 Postmenopausal bleeding: Secondary | ICD-10-CM

## 2021-03-10 ENCOUNTER — Other Ambulatory Visit: Payer: Self-pay

## 2021-03-10 ENCOUNTER — Encounter: Payer: Self-pay | Admitting: Obstetrics and Gynecology

## 2021-03-10 ENCOUNTER — Ambulatory Visit (INDEPENDENT_AMBULATORY_CARE_PROVIDER_SITE_OTHER): Payer: BC Managed Care – PPO | Admitting: Obstetrics and Gynecology

## 2021-03-10 DIAGNOSIS — N950 Postmenopausal bleeding: Secondary | ICD-10-CM

## 2021-03-10 NOTE — Progress Notes (Signed)
Patient ID: Brooke Hanna, female   DOB: December 08, 1965, 55 y.o.   MRN: [id number] Ms Meas is her for eval of PMB. Second episode in the last 2 yrs. Prior normal EMBX normal  U/S last month thicken endometrium and small fibroid  Episode of bleedin for a few days in March/April. None since Not sexual active  Pap smear UTD and normal  Mammogram ordered for next month   TSVD x 2 ( largest 8 #) SAB x 1  PE AF VSS Lungs clear Heart RRR Abd soft + BS  GU Nl EGBUS, uterus small, no masses  A/P PMB with thicken endometrium on U/S Recommend Hysteroscopy D & C. Reviewed with pt. R/B/Post care discussed. Will schedule. F/U with post op appt.

## 2021-03-10 NOTE — Patient Instructions (Addendum)
Postmenopausal Bleeding Postmenopausal bleeding is any bleeding that occurs after menopause. Menopause is a time in a woman's life when monthly periods stop. Any type of bleeding after menopause should be checked by your doctor. Treatment will depend on the cause. This kind of bleeding can be caused by:  Taking hormones during menopause.  Low or high amounts of female hormones in the body. This can cause the lining of the womb (uterus) to become too thin or too thick.  Cancer.  Growths in the womb that are not cancer. Follow these instructions at home:  Watch for any changes in your symptoms. Let your doctor know about them.  Avoid using tampons and douches as told by your doctor.  Change your pads regularly.  Get regular pelvic exams. This includes Pap tests.  Take iron pills as told by your doctor.  Take over-the-counter and prescription medicines only as told by your doctor.  Keep all follow-up visits.   Contact a doctor if:  You have new bleeding from the vagina after menopause.  You have pain in your belly (abdomen). Get help right away if:  You have a fever or chills.  You have very bad pain with bleeding.  You have clumps of blood (blood clots) coming from your vagina.  You have a lot of bleeding, and: ? You use more than 1 pad an hour. ? This kind of bleeding has never happened before.  You have headaches.  You feel dizzy or you feel like you are going to pass out (faint). Summary  Any type of bleeding after menopause should be checked by your doctor.  Avoid using tampons or douches.  Get regular pelvic exams. This includes Pap tests.  Contact a doctor if you have new bleeding or pain in your belly.  Watch for any changes in your symptoms. Let your doctor know about them. This information is not intended to replace advice given to you by your health care provider. Make sure you discuss any questions you have with your health care provider. Document  Revised: 03/19/2020 Document Reviewed: 03/19/2020 Elsevier Patient Education  2021 [place].  Hysteroscopy Hysteroscopy is a procedure used to look inside a woman's womb (uterus). This may be done for various reasons, including: To look for tumors and other growths in the uterus. To evaluate abnormal bleeding, fibroid tumors, polyps, scar tissue, or uterine cancer. To determine why a woman is unable to get pregnant or has had repeated pregnancy losses. To locate an IUD (intrauterine device). To place a birth control device into the fallopian tubes. During this procedure, a thin, flexible tube with a small light and camera (hysteroscope) is used to examine the uterus. The camera sends images to a monitor in the room so that your health care provider can view the inside of your uterus. A hysteroscopy should be done right after a menstrual period. Tell a health care provider about: Any allergies you have. All medicines you are taking, including vitamins, herbs, eye drops, creams, and over-the-counter medicines. Any problems you or family members have had with anesthetic medicines. Any blood disorders you have. Any surgeries you have had. Any medical conditions you have. Whether you are pregnant or may be pregnant. Whether you have been diagnosed with an STI (sexually transmitted infection) or you think you have an STI. What are the risks? Generally, this is a safe procedure. However, problems may occur, including: Excessive bleeding. Infection. Damage to the uterus or other structures or organs. Allergic reaction to medicines or  fluids that are used in the procedure. What happens before the procedure? Staying hydrated Follow instructions from your health care provider about hydration, which may include: Up to 2 hours before the procedure - you may continue to drink clear liquids, such as water, clear fruit juice, black coffee, and plain tea. Eating and drinking restrictions Follow  instructions from your health care provider about eating and drinking, which may include: 8 hours before the procedure - stop eating solid foods and drink clear liquids only. 2 hours before the procedure - stop drinking clear liquids. Medicines Ask your health care provider about: Changing or stopping your regular medicines. This is especially important if you are taking diabetes medicines or blood thinners. Taking medicines such as aspirin and ibuprofen. These medicines can thin your blood. Do not take these medicines unless your health care provider tells you to take them. Taking over-the-counter medicines, vitamins, herbs, and supplements. Medicine may be placed in your cervix the day before the procedure. This medicine causes the cervix to open (dilate). The larger opening makes it easier for the hysteroscope to be inserted into the uterus during the procedure. General instructions Ask your health care provider: What steps will be taken to help prevent infection. These steps may include: Washing skin with a germ-killing soap. Taking antibiotic medicine. Do not use any products that contain nicotine or tobacco for at least 4 weeks before the procedure. These products include cigarettes, chewing tobacco, and vaping devices, such as e-cigarettes. If you need help quitting, ask your health care provider. Plan to have a responsible adult take you home from the hospital or clinic. Plan to have a responsible adult care for you for the time you are told after you leave the hospital or clinic. This is important. Empty your bladder before the procedure begins. What happens during the procedure? An IV will be inserted into one of your veins. You may be given: A medicine to help you relax (sedative). A medicine that numbs the area around the cervix (local anesthetic). A medicine to make you fall asleep (general anesthetic). A hysteroscope will be inserted through your vagina and into your  uterus. Air or fluid will be used to enlarge your uterus to allow your health care provider to see it better. The amount of fluid used will be carefully checked throughout the procedure. In some cases, tissue may be gently scraped from inside the uterus and sent to a lab for testing (biopsy). The procedure may vary among health care providers and hospitals. What can I expect after the procedure? Your blood pressure, heart rate, breathing rate, and blood oxygen level will be monitored until you leave the hospital or clinic. You may have cramps. You may be given medicines for this. You may have bleeding, which may vary from light spotting to menstrual-like bleeding. This is normal. If you had a biopsy, it is up to you to get the results. Ask your health care provider, or the department that is doing the procedure, when your results will be ready. Follow these instructions at home: Activity Rest as told by your health care provider. Return to your normal activities as told by your health care provider. Ask your health care provider what activities are safe for you. If you were given a sedative during the procedure, it can affect you for several hours. Do not drive or operate machinery until your health care provider says that it is safe. Medicines Do not take aspirin or other NSAIDs during recovery, as told  by your healthcare provider. It can increase the risk of bleeding. Ask your health care provider if the medicine prescribed to you: Requires you to avoid driving or using machinery. Can cause constipation. You may need to take these actions to prevent or treat constipation: Drink enough fluid to keep your urine pale yellow. Take over-the-counter or prescription medicines. Eat foods that are high in fiber, such as beans, whole grains, and fresh fruits and vegetables. Limit foods that are high in fat and processed sugars, such as fried or sweet foods. General instructions Do not douche, use  tampons, or have sex for 2 weeks after the procedure, or until your health care provider approves. Do not take baths, swim, or use a hot tub until your health care provider approves. Take showers instead of baths for 2 weeks, or for as long as told by your health care provider. Keep all follow-up visits. This is important. Contact a health care provider if: You feel dizzy or lightheaded. You feel nauseous. You have abnormal vaginal discharge. You have a rash. You have pain that does not get better with medicine. You have chills. Get help right away if: You have bleeding that is heavier than a normal menstrual period. You have a fever. You have pain or cramps that get worse. You develop new abdominal pain. You faint. You have pain in your shoulder. You are short of breath. Summary Hysteroscopy is a procedure that is used to look inside a woman's womb (uterus). After the procedure, you may have bleeding, which varies from light spotting to menstrual-like bleeding. This is normal. You may also have cramps. Do not douche, use tampons, or have sex for 2 weeks after the procedure, or until your health care provider approves. Plan to have a responsible adult take you home from the hospital or clinic. This information is not intended to replace advice given to you by your health care provider. Make sure you discuss any questions you have with your health care provider. Document Revised: 05/20/2020 Document Reviewed: 05/20/2020 Elsevier Patient Education  2021 [employer].

## 2021-03-10 NOTE — Progress Notes (Signed)
No bleeding since visit with primary care.

## 2021-03-12 ENCOUNTER — Encounter: Payer: Self-pay | Admitting: *Deleted

## 2021-03-12 ENCOUNTER — Telehealth: Payer: Self-pay | Admitting: *Deleted

## 2021-03-12 NOTE — Telephone Encounter (Signed)
Call to pateint. Voice mail has number confirmation.  Left message with procedure date and arrival time. Advised will receive letter in the mail.  Call back to [phone] if questions.

## 2021-03-29 ENCOUNTER — Ambulatory Visit
Admission: RE | Admit: 2021-03-29 | Discharge: 2021-03-29 | Disposition: A | Discharge status: Home / Self Care | Payer: BC Managed Care – PPO | Source: Ambulatory Visit | Attending: Family Medicine | Admitting: Family Medicine

## 2021-03-29 ENCOUNTER — Other Ambulatory Visit: Payer: Self-pay

## 2021-03-29 DIAGNOSIS — Z1231 Encounter for screening mammogram for malignant neoplasm of breast: Secondary | ICD-10-CM

## 2021-04-01 ENCOUNTER — Other Ambulatory Visit: Payer: Self-pay | Admitting: Family Medicine

## 2021-04-01 DIAGNOSIS — R928 Other abnormal and inconclusive findings on diagnostic imaging of breast: Secondary | ICD-10-CM

## 2021-04-06 ENCOUNTER — Ambulatory Visit
Admission: RE | Admit: 2021-04-06 | Discharge: 2021-04-06 | Disposition: A | Discharge status: Home / Self Care | Payer: BC Managed Care – PPO | Source: Ambulatory Visit | Attending: Family Medicine | Admitting: Family Medicine

## 2021-04-06 ENCOUNTER — Other Ambulatory Visit: Payer: Self-pay

## 2021-04-06 ENCOUNTER — Other Ambulatory Visit: Payer: BC Managed Care – PPO

## 2021-04-06 DIAGNOSIS — R928 Other abnormal and inconclusive findings on diagnostic imaging of breast: Secondary | ICD-10-CM

## 2021-04-22 ENCOUNTER — Other Ambulatory Visit: Payer: Self-pay

## 2021-04-22 ENCOUNTER — Encounter (HOSPITAL_COMMUNITY): Payer: Self-pay | Admitting: Obstetrics and Gynecology

## 2021-04-22 NOTE — Progress Notes (Signed)
PCP - Dr. Tamala Julian Cardiologist -  EKG - DOS Chest x-ray -  ECHO -  Cardiac Cath -  CPAP -    ERAS Protcol - yes  COVID TEST- n/a  Anesthesia review: n/a  -------------  SDW INSTRUCTIONS:  Your procedure is scheduled on Tuesday 7/12. Please report to [facility] Main Entrance "A" at 11:00 A.M., and check in at the Admitting office. Call this number if you have problems the morning of surgery: [phone]   Remember: Do not eat after midnight the night before your surgery  You may drink clear liquids until 10:30 AM the morning of your surgery.   Clear liquids allowed are: Water, Non-Citrus Juices (without pulp), Carbonated Beverages, Clear Tea, Black Coffee Only, and Gatorade   Medications to take morning of surgery with a sip of water include:  SAXENDA - ok to take day before surgery, none DOS  As of today, STOP taking any Aspirin (unless otherwise instructed by your surgeon), Aleve, Naproxen, Ibuprofen, Motrin, Advil, Goody's, BC's, all herbal medications, fish oil, and all vitamins.    The Morning of Surgery Do not wear jewelry, make-up or nail polish. Do not wear lotions, powders, or perfumes or deodorant Do not shave 48 hours prior to surgery.   Do not bring valuables to the hospital. [facility] is not responsible for any belongings or valuables.  If you are a smoker, DO NOT Smoke 24 hours prior to surgery If you wear a CPAP at night please bring your mask the morning of surgery  Remember that you must have someone to transport you home after your surgery, and remain with you for 24 hours if you are discharged the same day.  Please bring cases for contacts, glasses, hearing aids, dentures or bridgework because it cannot be worn into surgery.   Patients discharged the day of surgery will not be allowed to drive home.   Please shower the NIGHT BEFORE/MORNING OF SURGERY (use antibacterial soap like DIAL soap if possible). Wear comfortable clothes the morning of  surgery. Oral Hygiene is also important to reduce your risk of infection.  Remember - BRUSH YOUR TEETH THE MORNING OF SURGERY WITH YOUR REGULAR TOOTHPASTE  Patient denies shortness of breath, fever, cough and chest pain.

## 2021-04-27 ENCOUNTER — Ambulatory Visit (HOSPITAL_COMMUNITY): Payer: BC Managed Care – PPO | Admitting: Physician Assistant

## 2021-04-27 ENCOUNTER — Encounter (HOSPITAL_COMMUNITY)
Admission: RE | Disposition: A | Discharge status: Home / Self Care | Payer: Self-pay | Source: Home / Self Care | Attending: Obstetrics and Gynecology

## 2021-04-27 ENCOUNTER — Encounter (HOSPITAL_COMMUNITY): Payer: Self-pay | Admitting: Obstetrics and Gynecology

## 2021-04-27 ENCOUNTER — Ambulatory Visit (HOSPITAL_COMMUNITY)
Admission: RE | Admit: 2021-04-27 | Discharge: 2021-04-27 | Disposition: A | Discharge status: Home / Self Care | Payer: BC Managed Care – PPO | Attending: Obstetrics and Gynecology | Admitting: Obstetrics and Gynecology

## 2021-04-27 DIAGNOSIS — N840 Polyp of corpus uteri: Secondary | ICD-10-CM | POA: Insufficient documentation

## 2021-04-27 DIAGNOSIS — Z882 Allergy status to sulfonamides status: Secondary | ICD-10-CM | POA: Diagnosis not present

## 2021-04-27 DIAGNOSIS — Z79899 Other long term (current) drug therapy: Secondary | ICD-10-CM | POA: Diagnosis not present

## 2021-04-27 DIAGNOSIS — N950 Postmenopausal bleeding: Secondary | ICD-10-CM | POA: Insufficient documentation

## 2021-04-27 HISTORY — PX: HYSTEROSCOPY WITH D & C: SHX1775

## 2021-04-27 LAB — CBC
HCT: 37.5 % (ref 36.0–46.0)
Hemoglobin: 12.2 g/dL (ref 12.0–15.0)
MCH: 28.6 pg (ref 26.0–34.0)
MCHC: 32.5 g/dL (ref 30.0–36.0)
MCV: 87.8 fL (ref 80.0–100.0)
Platelets: 148 10*3/uL — ABNORMAL LOW (ref 150–400)
RBC: 4.27 MIL/uL (ref 3.87–5.11)
RDW: 14.6 % (ref 11.5–15.5)
WBC: 5.4 10*3/uL (ref 4.0–10.5)
nRBC: 0 % (ref 0.0–0.2)

## 2021-04-27 LAB — BASIC METABOLIC PANEL
Anion gap: 14 (ref 5–15)
BUN: 12 mg/dL (ref 6–20)
CO2: 24 mmol/L (ref 22–32)
Calcium: 9.4 mg/dL (ref 8.9–10.3)
Chloride: 103 mmol/L (ref 98–111)
Creatinine, Ser: 0.85 mg/dL (ref 0.44–1.00)
GFR, Estimated: >60 mL/min (ref >60)
Glucose, Bld: 86 mg/dL (ref 70–99)
Potassium: 4.3 mmol/L (ref 3.5–5.1)
Sodium: 141 mmol/L (ref 135–145)

## 2021-04-27 SURGERY — DILATATION AND CURETTAGE /HYSTEROSCOPY
Anesthesia: General | Site: Uterus

## 2021-04-27 MED ORDER — MIDAZOLAM HCL 5 MG/5ML IJ SOLN
INTRAMUSCULAR | Status: DC | PRN
  Administered 2021-04-27: 2 mg via INTRAVENOUS

## 2021-04-27 MED ORDER — CHLORHEXIDINE GLUCONATE 0.12 % MT SOLN: 15.0000 mL | Freq: Once | OROMUCOSAL | Status: AC

## 2021-04-27 MED ORDER — DOXYCYCLINE HYCLATE 100 MG IV SOLR
200.0000 mg | INTRAVENOUS | Status: AC
  Administered 2021-04-27: 200 mg via INTRAVENOUS
  Filled 2021-04-27: qty 200

## 2021-04-27 MED ORDER — ACETAMINOPHEN 500 MG PO TABS: 1000.0000 mg | ORAL_TABLET | ORAL | Status: AC

## 2021-04-27 MED ORDER — ONDANSETRON HCL 4 MG/2ML IJ SOLN
INTRAMUSCULAR | Status: DC | PRN
  Administered 2021-04-27: 4 mg via INTRAVENOUS

## 2021-04-27 MED ORDER — CHLORHEXIDINE GLUCONATE 0.12 % MT SOLN
OROMUCOSAL | Status: AC
  Administered 2021-04-27: 15 mL via OROMUCOSAL
  Filled 2021-04-27: qty 15

## 2021-04-27 MED ORDER — FENTANYL CITRATE (PF) 250 MCG/5ML IJ SOLN
INTRAMUSCULAR | Status: DC | PRN
  Administered 2021-04-27 (×2): 50 ug via INTRAVENOUS

## 2021-04-27 MED ORDER — FERRIC SUBSULFATE 259 MG/GM EX SOLN
CUTANEOUS | Status: AC
  Filled 2021-04-27: qty 8

## 2021-04-27 MED ORDER — SUCCINYLCHOLINE CHLORIDE 200 MG/10ML IV SOSY
PREFILLED_SYRINGE | INTRAVENOUS | Status: AC
  Filled 2021-04-27: qty 10

## 2021-04-27 MED ORDER — PROPOFOL 10 MG/ML IV BOLUS
INTRAVENOUS | Status: AC
  Filled 2021-04-27: qty 20

## 2021-04-27 MED ORDER — ACETAMINOPHEN 500 MG PO TABS
ORAL_TABLET | ORAL | Status: AC
  Administered 2021-04-27: 1000 mg via ORAL
  Filled 2021-04-27: qty 2

## 2021-04-27 MED ORDER — KETOROLAC TROMETHAMINE 30 MG/ML IJ SOLN
INTRAMUSCULAR | Status: DC | PRN
  Administered 2021-04-27: 15 mg via INTRAVENOUS

## 2021-04-27 MED ORDER — LIDOCAINE 2% (20 MG/ML) 5 ML SYRINGE
INTRAMUSCULAR | Status: DC | PRN
  Administered 2021-04-27: 80 mg via INTRAVENOUS

## 2021-04-27 MED ORDER — LACTATED RINGERS IV SOLN: INTRAVENOUS | Status: DC

## 2021-04-27 MED ORDER — IBUPROFEN 800 MG PO TABS: 800.0000 mg | ORAL_TABLET | Freq: Three times a day (TID) | ORAL | 0 refills | Status: AC | PRN

## 2021-04-27 MED ORDER — SUCCINYLCHOLINE CHLORIDE 200 MG/10ML IV SOSY
PREFILLED_SYRINGE | INTRAVENOUS | Status: DC | PRN
  Administered 2021-04-27: 180 mg via INTRAVENOUS

## 2021-04-27 MED ORDER — FENTANYL CITRATE (PF) 100 MCG/2ML IJ SOLN: 25.0000 ug | INTRAMUSCULAR | Status: DC | PRN

## 2021-04-27 MED ORDER — DEXAMETHASONE SODIUM PHOSPHATE 10 MG/ML IJ SOLN
INTRAMUSCULAR | Status: AC
  Filled 2021-04-27: qty 1

## 2021-04-27 MED ORDER — SOD CITRATE-CITRIC ACID 500-334 MG/5ML PO SOLN
ORAL | Status: AC
  Administered 2021-04-27: 30 mL via ORAL
  Filled 2021-04-27: qty 30

## 2021-04-27 MED ORDER — PROMETHAZINE HCL 25 MG/ML IJ SOLN: 6.2500 mg | INTRAMUSCULAR | Status: DC | PRN

## 2021-04-27 MED ORDER — MIDAZOLAM HCL 2 MG/2ML IJ SOLN
INTRAMUSCULAR | Status: AC
  Filled 2021-04-27: qty 2

## 2021-04-27 MED ORDER — KETOROLAC TROMETHAMINE 15 MG/ML IJ SOLN
INTRAMUSCULAR | Status: AC
  Administered 2021-04-27: 15 mg via INTRAVENOUS
  Filled 2021-04-27: qty 1

## 2021-04-27 MED ORDER — PROPOFOL 10 MG/ML IV BOLUS
INTRAVENOUS | Status: DC | PRN
  Administered 2021-04-27: 200 mg via INTRAVENOUS

## 2021-04-27 MED ORDER — ONDANSETRON HCL 4 MG/2ML IJ SOLN
INTRAMUSCULAR | Status: AC
  Filled 2021-04-27: qty 2

## 2021-04-27 MED ORDER — BUPIVACAINE HCL 0.5 % IJ SOLN
INTRAMUSCULAR | Status: DC | PRN
  Administered 2021-04-27: 10 mL

## 2021-04-27 MED ORDER — DEXAMETHASONE SODIUM PHOSPHATE 10 MG/ML IJ SOLN
INTRAMUSCULAR | Status: DC | PRN
  Administered 2021-04-27: 10 mg via INTRAVENOUS

## 2021-04-27 MED ORDER — OXYCODONE HCL 5 MG/5ML PO SOLN: 5.0000 mg | Freq: Once | ORAL | Status: DC | PRN

## 2021-04-27 MED ORDER — FENTANYL CITRATE (PF) 250 MCG/5ML IJ SOLN
INTRAMUSCULAR | Status: AC
  Filled 2021-04-27: qty 5

## 2021-04-27 MED ORDER — POVIDONE-IODINE 10 % EX SWAB: 2.0000 "application " | Freq: Once | CUTANEOUS | Status: DC

## 2021-04-27 MED ORDER — KETOROLAC TROMETHAMINE 30 MG/ML IJ SOLN
INTRAMUSCULAR | Status: AC
  Filled 2021-04-27: qty 1

## 2021-04-27 MED ORDER — LIDOCAINE 2% (20 MG/ML) 5 ML SYRINGE
INTRAMUSCULAR | Status: AC
  Filled 2021-04-27: qty 5

## 2021-04-27 MED ORDER — OXYCODONE HCL 5 MG PO TABS: 5.0000 mg | ORAL_TABLET | Freq: Once | ORAL | Status: DC | PRN

## 2021-04-27 MED ORDER — SODIUM CHLORIDE 0.9 % IR SOLN
Status: DC | PRN
  Administered 2021-04-27: 3000 mL

## 2021-04-27 MED ORDER — OXYCODONE HCL 5 MG PO TABS: 5.0000 mg | ORAL_TABLET | Freq: Four times a day (QID) | ORAL | 0 refills | Status: AC | PRN

## 2021-04-27 MED ORDER — BUPIVACAINE HCL (PF) 0.5 % IJ SOLN
INTRAMUSCULAR | Status: AC
  Filled 2021-04-27: qty 60

## 2021-04-27 MED ORDER — KETOROLAC TROMETHAMINE 15 MG/ML IJ SOLN: 15.0000 mg | INTRAMUSCULAR | Status: AC

## 2021-04-27 MED ORDER — ORAL CARE MOUTH RINSE: 15.0000 mL | Freq: Once | OROMUCOSAL | Status: AC

## 2021-04-27 MED ORDER — SOD CITRATE-CITRIC ACID 500-334 MG/5ML PO SOLN: 30.0000 mL | ORAL | Status: AC

## 2021-04-27 SURGICAL SUPPLY — 13 items
CATH ROBINSON RED A/P 16FR (CATHETERS) ×2 IMPLANT
ELECT REM PT RETURN 9FT ADLT (ELECTROSURGICAL)
ELECTRODE REM PT RTRN 9FT ADLT (ELECTROSURGICAL) IMPLANT
GLOVE SURG ENC MOIS LTX SZ7.5 (GLOVE) ×2 IMPLANT
GLOVE SURG UNDER POLY LF SZ7 (GLOVE) ×2 IMPLANT
GOWN STRL REUS W/ TWL LRG LVL3 (GOWN DISPOSABLE) ×1 IMPLANT
GOWN STRL REUS W/ TWL XL LVL3 (GOWN DISPOSABLE) ×1 IMPLANT
GOWN STRL REUS W/TWL LRG LVL3 (GOWN DISPOSABLE) ×2
GOWN STRL REUS W/TWL XL LVL3 (GOWN DISPOSABLE) ×2
KIT PROCEDURE FLUENT (KITS) ×2 IMPLANT
PACK VAGINAL MINOR WOMEN LF (CUSTOM PROCEDURE TRAY) ×2 IMPLANT
PAD OB MATERNITY 4.3X12.25 (PERSONAL CARE ITEMS) ×2 IMPLANT
TOWEL GREEN STERILE FF (TOWEL DISPOSABLE) ×4 IMPLANT

## 2021-04-27 NOTE — Op Note (Signed)
PREOPERATIVE DIAGNOSIS: PMB POSTOPERATIVE DIAGNOSIS: The same PROCEDURE: Hysteroscopy, Dilation and Curettage. SURGEON:  Dr. Arlina Robes   INDICATIONS: 55 y.o. [id number]  here for scheduled surgery for the aforementioned diagnoses.   Risks of surgery were discussed with the patient including but not limited to: bleeding which may require transfusion; infection which may require antibiotics; injury to uterus or surrounding organs; intrauterine scarring which may impair future fertility; need for additional procedures including laparotomy or laparoscopy; and other postoperative/anesthesia complications. Written informed consent was obtained.    FINDINGS:  A 8 week size uterus.  Endometrium polyp ANESTHESIA:   General, paracervical block with 10 ml of 0.5% Marcaine INTRAVENOUS FLUIDS:  As recorded FLUID DEFICITS:  85 cc NS ESTIMATED BLOOD LOSS:  Less than 20 ml SPECIMENS: Endometrial curettings and polyp sent to pathology COMPLICATIONS:  None immediate.  PROCEDURE DETAILS:  The patient received intravenous antibiotics while in the preoperative area.  She was then taken to the operating room where general anesthesia was administered and was found to be adequate.  After an adequate timeout was performed, she was placed in the dorsal lithotomy position and examined; then prepped and draped in the sterile manner.   Her bladder was catheterized for an unmeasured amount of clear, yellow urine. A speculum was then placed in the patient's vagina and a single tooth tenaculum was applied to the anterior lip of the cervix.   A paracervical block using 10 ml of 0.5% Marcaine was administered.  The cervix was sounded to 8 cm and dilated manually with metal dilators to accommodate the 5 mm diagnostic hysteroscope.  Once the cervix was dilated, the hysteroscope was inserted under direct visualization using NS as a suspension medium.  The uterine cavity was carefully examined with the findings as noted above.   After  further careful visualization of the uterine cavity, the hysteroscope was removed under direct visualization.  Polyp forceps were used to remove the polyp. A sharp curettage was then performed as well.  The tenaculum was removed from the anterior lip of the cervix and the vaginal speculum was removed after noting good hemostasis.  The patient tolerated the procedure well and was taken to the recovery area awake, extubated and in stable condition.  The patient will be discharged to home as per PACU criteria.  Routine postoperative instructions given.  She was prescribed Percocet and Ibuprofen

## 2021-04-27 NOTE — Anesthesia Procedure Notes (Signed)
Procedure Name: Intubation Date/Time: 04/27/2021 11:17 AM Performed by: Geraldine Contras, CRNA Pre-anesthesia Checklist: Patient identified, Patient being monitored, Timeout performed, Emergency Drugs available and Suction available Patient Re-evaluated:Patient Re-evaluated prior to induction Oxygen Delivery Method: Circle system utilized Preoxygenation: Pre-oxygenation with 100% oxygen Induction Type: IV induction Ventilation: Mask ventilation without difficulty Laryngoscope Size: Mac and 3 Grade View: Grade I Tube type: Oral Tube size: 7.0 mm Number of attempts: 1 Airway Equipment and Method: Stylet Placement Confirmation: ETT inserted through vocal cords under direct vision, positive ETCO2 and breath sounds checked- equal and bilateral Secured at: 21 cm Tube secured with: Tape Dental Injury: Teeth and Oropharynx as per pre-operative assessment  Comments: LMA #4 attempted prior to intubation.Unable to achieve adequate seal/unable to ventilate. Changed plan to ETT.

## 2021-04-27 NOTE — Anesthesia Postprocedure Evaluation (Signed)
Anesthesia Post Note  Patient: Brooke Hanna  Procedure(s) Performed: DILATATION AND CURETTAGE /HYSTEROSCOPY (Uterus)     Patient location during evaluation: PACU Anesthesia Type: General Level of consciousness: awake and alert Pain management: pain level controlled Vital Signs Assessment: post-procedure vital signs reviewed and stable Respiratory status: spontaneous breathing, nonlabored ventilation, respiratory function stable and patient connected to nasal cannula oxygen Cardiovascular status: blood pressure returned to baseline and stable Postop Assessment: no apparent nausea or vomiting Anesthetic complications: no   No notable events documented.  Last Vitals:  Vitals:   04/27/21 1232 04/27/21 1247  BP: (!) 139/94 (!) 143/90  Pulse: 73 69  Resp: 20 14  Temp:  36.5 C  SpO2: 99% 98%    Last Pain:  Vitals:   04/27/21 1247  TempSrc:   PainSc: 0-No pain                 Jud Fanguy S

## 2021-04-27 NOTE — Transfer of Care (Signed)
Immediate Anesthesia Transfer of Care Note  Patient: Brooke Hanna  Procedure(s) Performed: DILATATION AND CURETTAGE /HYSTEROSCOPY (Uterus)  Patient Location: PACU  Anesthesia Type:General  Level of Consciousness: awake  Airway & Oxygen Therapy: Patient Spontanous Breathing  Post-op Assessment: Report given to RN  Post vital signs: stable  Last Vitals:  Vitals Value Taken Time  BP 144/95 04/27/21 1217  Temp    Pulse 78 04/27/21 1221  Resp 23 04/27/21 1221  SpO2 99 % 04/27/21 1221  Vitals shown include unvalidated device data.  Last Pain:  Vitals:   04/27/21 1217  TempSrc:   PainSc: 0-No pain      Patients Stated Pain Goal: 5 (41/58/30 9407)  Complications: No notable events documented.

## 2021-04-27 NOTE — Anesthesia Preprocedure Evaluation (Signed)
Anesthesia Evaluation  Patient identified by MRN, date of birth, ID band Patient awake    Reviewed: Allergy & Precautions, NPO status , Patient's Chart, lab work & pertinent test results  Airway Mallampati: II  TM Distance: >3 FB Neck ROM: Full    Dental no notable dental hx.    Pulmonary neg pulmonary ROS,    Pulmonary exam normal breath sounds clear to auscultation       Cardiovascular hypertension, Normal cardiovascular exam Rhythm:Regular Rate:Normal     Neuro/Psych negative neurological ROS  negative psych ROS   GI/Hepatic negative GI ROS, Neg liver ROS,   Endo/Other  Morbid obesity  Renal/GU negative Renal ROS  negative genitourinary   Musculoskeletal negative musculoskeletal ROS (+)   Abdominal   Peds negative pediatric ROS (+)  Hematology negative hematology ROS (+)   Anesthesia Other Findings   Reproductive/Obstetrics negative OB ROS                             Anesthesia Physical Anesthesia Plan  ASA: 3  Anesthesia Plan: General   Post-op Pain Management:    Induction: Intravenous  PONV Risk Score and Plan: 3 and Ondansetron, Dexamethasone, Treatment may vary due to age or medical condition and Midazolam  Airway Management Planned: LMA  Additional Equipment:   Intra-op Plan:   Post-operative Plan: Extubation in OR  Informed Consent: I have reviewed the patients History and Physical, chart, labs and discussed the procedure including the risks, benefits and alternatives for the proposed anesthesia with the patient or authorized representative who has indicated his/her understanding and acceptance.     Dental advisory given  Plan Discussed with: CRNA and Surgeon  Anesthesia Plan Comments:         Anesthesia Quick Evaluation

## 2021-04-27 NOTE — H&P (Signed)
Brooke Hanna is an 55 y.o. female with 2 episodes of PMB this year. Prior EMBX negative. GYN U/S thicken endometrium.    Menstrual History: Menarche age: 41 No LMP recorded. Patient is postmenopausal.    Past Medical History:  Diagnosis Date   Hypertension     Past Surgical History:  Procedure Laterality Date   TONSILLECTOMY     TUBAL LIGATION      Family History  Problem Relation Age of Onset   Breast cancer Maternal Aunt    BRCA 1/2 Neg Hx    Colon cancer Neg Hx    Esophageal cancer Neg Hx    Liver cancer Neg Hx    Pancreatic cancer Neg Hx    Rectal cancer Neg Hx    Stomach cancer Neg Hx     Social History:  reports that she has never smoked. She has never used smokeless tobacco. She reports current alcohol use. She reports that she does not use drugs.  Allergies:  Allergies  Allergen Reactions   Sulfa Antibiotics Swelling    Facility-Administered Medications Prior to Admission  Medication Dose Route Frequency Provider Last Rate Last Admin   0.9 %  sodium chloride infusion  500 mL Intravenous Once Armbruster, Carlota Raspberry, MD       Medications Prior to Admission  Medication Sig Dispense Refill Last Dose   hydrochlorothiazide (HYDRODIURIL) 25 MG tablet Take 25 mg by mouth every morning.   04/26/2021   SAXENDA 18 MG/3ML SOPN Inject 3 mLs into the skin daily.   04/26/2021   telmisartan (MICARDIS) 40 MG tablet Take 40 mg by mouth daily.   04/26/2021   misoprostol (CYTOTEC) 200 MCG tablet Place 2 tablets (400 mcg total) vaginally once for 1 dose. 3 hours before your appointment time for procedure. (Patient not taking: Reported on 04/20/2021) 2 tablet 0 Not Taking    Review of Systems  Constitutional: Negative.   Respiratory: Negative.    Cardiovascular: Negative.   Gastrointestinal: Negative.   Genitourinary:  Positive for menstrual problem.   Blood pressure (!) 170/101, pulse 85, temperature 98.1 F (36.7 C), temperature source Oral, resp. rate 18, height $RemoveBe'5\' 3"'[id number]$   (1.6 m), weight 104.8 kg, SpO2 100 %. Physical Exam Constitutional:      Appearance: Normal appearance.  Cardiovascular:     Rate and Rhythm: Normal rate and regular rhythm.  Pulmonary:     Effort: Pulmonary effort is normal.     Breath sounds: Normal breath sounds.  Abdominal:     General: Bowel sounds are normal.     Palpations: Abdomen is soft.  Genitourinary:    Comments: Nl EGBUS, uterus small, mobile, no masses Neurological:     Mental Status: She is alert.    Results for orders placed or performed during the hospital encounter of 04/27/21 (from the past 24 hour(s))  Basic metabolic panel per protocol     Status: None   Collection Time: 04/27/21  9:20 AM  Result Value Ref Range   Sodium 141 135 - 145 mmol/L   Potassium 4.3 3.5 - 5.1 mmol/L   Chloride 103 98 - 111 mmol/L   CO2 24 22 - 32 mmol/L   Glucose, Bld 86 70 - 99 mg/dL   BUN 12 6 - 20 mg/dL   Creatinine, Ser 0.85 0.44 - 1.00 mg/dL   Calcium 9.4 8.9 - 10.3 mg/dL   GFR, Estimated >60 >60 mL/min   Anion gap 14 5 - 15  CBC     Status: Abnormal  Collection Time: 04/27/21  9:21 AM  Result Value Ref Range   WBC 5.4 4.0 - 10.5 K/uL   RBC 4.27 3.87 - 5.11 MIL/uL   Hemoglobin 12.2 12.0 - 15.0 g/dL   HCT 37.5 36.0 - 46.0 %   MCV 87.8 80.0 - 100.0 fL   MCH 28.6 26.0 - 34.0 pg   MCHC 32.5 30.0 - 36.0 g/dL   RDW 14.6 11.5 - 15.5 %   Platelets 148 (L) 150 - 400 K/uL   nRBC 0.0 0.0 - 0.2 %    No results found.  Assessment/Plan: PMB in setting of thicken endometrium on U/S  Hysteroscopy D & C has been recommended to pt. R/B/Post op care has been reviewed. Pt has verbalized understanding and agrees to proceed.   Chancy Milroy 04/27/2021, 10:55 AM

## 2021-04-28 ENCOUNTER — Encounter (HOSPITAL_COMMUNITY): Payer: Self-pay | Admitting: Obstetrics and Gynecology

## 2021-04-28 LAB — SURGICAL PATHOLOGY

## 2021-06-03 ENCOUNTER — Encounter: Payer: BC Managed Care – PPO | Admitting: Obstetrics and Gynecology

## 2022-01-27 ENCOUNTER — Other Ambulatory Visit: Payer: Self-pay | Admitting: Orthopedic Surgery

## 2022-01-27 DIAGNOSIS — M25511 Pain in right shoulder: Secondary | ICD-10-CM

## 2022-02-06 ENCOUNTER — Other Ambulatory Visit: Payer: Self-pay | Admitting: Orthopedic Surgery

## 2022-02-06 ENCOUNTER — Ambulatory Visit
Admission: RE | Admit: 2022-02-06 | Discharge: 2022-02-06 | Disposition: A | Discharge status: Home / Self Care | Payer: BC Managed Care – PPO | Source: Ambulatory Visit | Attending: Orthopedic Surgery | Admitting: Orthopedic Surgery

## 2022-02-06 DIAGNOSIS — M25511 Pain in right shoulder: Secondary | ICD-10-CM

## 2022-05-06 ENCOUNTER — Other Ambulatory Visit: Payer: Self-pay | Admitting: Family Medicine

## 2022-05-06 DIAGNOSIS — Z1231 Encounter for screening mammogram for malignant neoplasm of breast: Secondary | ICD-10-CM

## 2022-05-27 ENCOUNTER — Ambulatory Visit: Payer: BC Managed Care – PPO

## 2022-06-07 ENCOUNTER — Inpatient Hospital Stay: Admission: RE | Admit: 2022-06-07 | Payer: BC Managed Care – PPO | Source: Ambulatory Visit

## 2024-01-19 ENCOUNTER — Other Ambulatory Visit: Payer: Self-pay | Admitting: Family Medicine

## 2024-01-19 DIAGNOSIS — Z1231 Encounter for screening mammogram for malignant neoplasm of breast: Secondary | ICD-10-CM

## 2024-01-26 ENCOUNTER — Ambulatory Visit
Admission: RE | Admit: 2024-01-26 | Discharge: 2024-01-26 | Disposition: A | Discharge status: Home / Self Care | Payer: Self-pay | Source: Ambulatory Visit | Attending: Family Medicine | Admitting: Family Medicine

## 2024-01-26 DIAGNOSIS — Z1231 Encounter for screening mammogram for malignant neoplasm of breast: Secondary | ICD-10-CM
